# Patient Record
Sex: Male | Born: 1935 | Race: White | Hispanic: No | Marital: Married | Smoking: Former smoker
Health system: Southern US, Community
[De-identification: ages and names within clinical notes are randomized; demographics above are authoritative.]

## PROBLEM LIST (undated history)

## (undated) DIAGNOSIS — I10 Essential (primary) hypertension: Secondary | ICD-10-CM

## (undated) DIAGNOSIS — I2510 Atherosclerotic heart disease of native coronary artery without angina pectoris: Secondary | ICD-10-CM

## (undated) HISTORY — PX: ABDOMINAL AORTIC ANEURYSM REPAIR: SUR1152

## (undated) HISTORY — PX: COLECTOMY: SHX59

## (undated) HISTORY — PX: CORONARY ARTERY BYPASS GRAFT: SHX141

## (undated) HISTORY — PX: HERNIA REPAIR: SHX51

---

## 2020-01-10 ENCOUNTER — Inpatient Hospital Stay (HOSPITAL_BASED_OUTPATIENT_CLINIC_OR_DEPARTMENT_OTHER)
Admission: EM | Admit: 2020-01-10 | Discharge: 2020-01-16 | DRG: 481 | Disposition: A | Discharge status: Skilled Nursing Facility | Payer: Medicare Other | Attending: Internal Medicine | Admitting: Internal Medicine

## 2020-01-10 ENCOUNTER — Emergency Department (HOSPITAL_BASED_OUTPATIENT_CLINIC_OR_DEPARTMENT_OTHER): Payer: Medicare Other

## 2020-01-10 ENCOUNTER — Encounter (HOSPITAL_BASED_OUTPATIENT_CLINIC_OR_DEPARTMENT_OTHER): Payer: Self-pay | Admitting: *Deleted

## 2020-01-10 ENCOUNTER — Other Ambulatory Visit: Payer: Self-pay

## 2020-01-10 DIAGNOSIS — S72141A Displaced intertrochanteric fracture of right femur, initial encounter for closed fracture: Secondary | ICD-10-CM | POA: Diagnosis not present

## 2020-01-10 DIAGNOSIS — S72001A Fracture of unspecified part of neck of right femur, initial encounter for closed fracture: Secondary | ICD-10-CM

## 2020-01-10 DIAGNOSIS — D638 Anemia in other chronic diseases classified elsewhere: Secondary | ICD-10-CM | POA: Diagnosis present

## 2020-01-10 DIAGNOSIS — E785 Hyperlipidemia, unspecified: Secondary | ICD-10-CM | POA: Diagnosis present

## 2020-01-10 DIAGNOSIS — Z951 Presence of aortocoronary bypass graft: Secondary | ICD-10-CM

## 2020-01-10 DIAGNOSIS — Z419 Encounter for procedure for purposes other than remedying health state, unspecified: Secondary | ICD-10-CM

## 2020-01-10 DIAGNOSIS — D696 Thrombocytopenia, unspecified: Secondary | ICD-10-CM | POA: Diagnosis present

## 2020-01-10 DIAGNOSIS — D539 Nutritional anemia, unspecified: Secondary | ICD-10-CM | POA: Diagnosis present

## 2020-01-10 DIAGNOSIS — S72009A Fracture of unspecified part of neck of unspecified femur, initial encounter for closed fracture: Secondary | ICD-10-CM | POA: Diagnosis present

## 2020-01-10 DIAGNOSIS — W1830XA Fall on same level, unspecified, initial encounter: Secondary | ICD-10-CM | POA: Diagnosis present

## 2020-01-10 DIAGNOSIS — D62 Acute posthemorrhagic anemia: Secondary | ICD-10-CM | POA: Diagnosis not present

## 2020-01-10 DIAGNOSIS — H9190 Unspecified hearing loss, unspecified ear: Secondary | ICD-10-CM | POA: Diagnosis present

## 2020-01-10 DIAGNOSIS — E440 Moderate protein-calorie malnutrition: Secondary | ICD-10-CM | POA: Insufficient documentation

## 2020-01-10 DIAGNOSIS — Z87891 Personal history of nicotine dependence: Secondary | ICD-10-CM

## 2020-01-10 DIAGNOSIS — Z6823 Body mass index (BMI) 23.0-23.9, adult: Secondary | ICD-10-CM

## 2020-01-10 DIAGNOSIS — I1310 Hypertensive heart and chronic kidney disease without heart failure, with stage 1 through stage 4 chronic kidney disease, or unspecified chronic kidney disease: Secondary | ICD-10-CM | POA: Diagnosis present

## 2020-01-10 DIAGNOSIS — I739 Peripheral vascular disease, unspecified: Secondary | ICD-10-CM | POA: Diagnosis present

## 2020-01-10 DIAGNOSIS — W19XXXA Unspecified fall, initial encounter: Secondary | ICD-10-CM

## 2020-01-10 DIAGNOSIS — N1832 Chronic kidney disease, stage 3b: Secondary | ICD-10-CM | POA: Diagnosis present

## 2020-01-10 DIAGNOSIS — Z20822 Contact with and (suspected) exposure to covid-19: Secondary | ICD-10-CM | POA: Diagnosis present

## 2020-01-10 DIAGNOSIS — I2510 Atherosclerotic heart disease of native coronary artery without angina pectoris: Secondary | ICD-10-CM | POA: Diagnosis present

## 2020-01-10 HISTORY — DX: Essential (primary) hypertension: I10

## 2020-01-10 HISTORY — DX: Atherosclerotic heart disease of native coronary artery without angina pectoris: I25.10

## 2020-01-10 LAB — COMPREHENSIVE METABOLIC PANEL
ALT: 17 U/L (ref 0–44)
AST: 22 U/L (ref 15–41)
Albumin: 3.9 g/dL (ref 3.5–5.0)
Alkaline Phosphatase: 55 U/L (ref 38–126)
Anion gap: 11 (ref 5–15)
BUN: 42 mg/dL — ABNORMAL HIGH (ref 8–23)
CO2: 24 mmol/L (ref 22–32)
Calcium: 8.9 mg/dL (ref 8.9–10.3)
Chloride: 105 mmol/L (ref 98–111)
Creatinine, Ser: 1.82 mg/dL — ABNORMAL HIGH (ref 0.61–1.24)
GFR, Estimated: 34 mL/min — ABNORMAL LOW (ref >60)
Glucose, Bld: 118 mg/dL — ABNORMAL HIGH (ref 70–99)
Potassium: 5.1 mmol/L (ref 3.5–5.1)
Sodium: 140 mmol/L (ref 135–145)
Total Bilirubin: 0.3 mg/dL (ref 0.3–1.2)
Total Protein: 6.6 g/dL (ref 6.5–8.1)

## 2020-01-10 LAB — CBC WITH DIFFERENTIAL/PLATELET
Abs Immature Granulocytes: 0.04 10*3/uL (ref 0.00–0.07)
Basophils Absolute: 0 10*3/uL (ref 0.0–0.1)
Basophils Relative: 0 %
Eosinophils Absolute: 0.2 10*3/uL (ref 0.0–0.5)
Eosinophils Relative: 2 %
HCT: 38.3 % — ABNORMAL LOW (ref 39.0–52.0)
Hemoglobin: 12.8 g/dL — ABNORMAL LOW (ref 13.0–17.0)
Immature Granulocytes: 0 %
Lymphocytes Relative: 32 %
Lymphs Abs: 3.8 10*3/uL (ref 0.7–4.0)
MCH: 33.4 pg (ref 26.0–34.0)
MCHC: 33.4 g/dL (ref 30.0–36.0)
MCV: 100 fL (ref 80.0–100.0)
Monocytes Absolute: 0.8 10*3/uL (ref 0.1–1.0)
Monocytes Relative: 7 %
Neutro Abs: 6.9 10*3/uL (ref 1.7–7.7)
Neutrophils Relative %: 59 %
Platelets: 159 10*3/uL (ref 150–400)
RBC: 3.83 MIL/uL — ABNORMAL LOW (ref 4.22–5.81)
RDW: 12.7 % (ref 11.5–15.5)
WBC: 11.7 10*3/uL — ABNORMAL HIGH (ref 4.0–10.5)
nRBC: 0 % (ref 0.0–0.2)

## 2020-01-10 LAB — RESP PANEL BY RT-PCR (FLU A&B, COVID) ARPGX2
Influenza A by PCR: NEGATIVE
Influenza B by PCR: NEGATIVE
SARS Coronavirus 2 by RT PCR: NEGATIVE

## 2020-01-10 MED ORDER — SODIUM CHLORIDE 0.9 % IV SOLN: INTRAVENOUS | Status: DC

## 2020-01-10 NOTE — ED Triage Notes (Signed)
EDP in room upon pt arrival, C Collar removed by EDP

## 2020-01-10 NOTE — ED Notes (Signed)
HIGH FALL RISK BRACELET PLACED ON PT, PT INSTRUCTED NOT TO GET UP WITHOUT STAFF IN ROOM, BED IN LOWEST POSITION, SR X 2 UP, CALL BELL WITHIN REACH, WIFE AT SIDE, PLACED ON CONT POX MONITORING WITH INT NBP ASSESSMENTS

## 2020-01-10 NOTE — ED Notes (Signed)
Alert and oriented, wife at side to assist with obtaining hx. Pt points to rt lateral hip area where pain is the most. No discoloration or swelling currently noted at this time

## 2020-01-10 NOTE — ED Triage Notes (Signed)
Presents via [place], per EMS, pt fell, sliding out of his chair at home, pt c/o rt hip pain, per EMS and this assessment, no external rotation or shortening of RLE noted, EMS states pt denies his head hitting anything and currently not on any blood thinners. Arrived with C collar on by EMS

## 2020-01-10 NOTE — ED Provider Notes (Signed)
[facility] EMERGENCY DEPARTMENT Provider Note   CSN: [id number] Arrival date & time: 01/10/20  1919     History Chief Complaint  Patient presents with  . Fall    Jacob [name] is a 86 y.o. male.  Patient brought in by EMS from home.  Patient was pivoting to get in his chair when he fell.  Landing on his right hip.  Complaint of right hip pain.  Patient arrived by EMS with a c-collar.  But initially patient with good range of motion of the neck so was cleared.  But then he started to develop some neck discomfort.  Patient is not on any blood thinners.  Normally followed in the [place] system.  Has medical history significant for coronary disease and hypertension.  Cording patient's wife patient was fine earlier today and yesterday.  No signs of any illness        Past Medical History:  Diagnosis Date  . Coronary artery disease   . Hypertension     There are no problems to display for this patient.      No family history on file.  Social History   Tobacco Use  . Smoking status: Former Games developer  . Tobacco comment: quit 50years ago  Vaping Use  . Vaping Use: Never used  Substance Use Topics  . Alcohol use: Yes    Comment: socially  . Drug use: Never    Home Medications Prior to Admission medications   Not on File    Allergies    Patient has no known allergies.  Review of Systems   Review of Systems  Constitutional: Negative for chills and fever.  HENT: Negative for rhinorrhea and sore throat.   Eyes: Negative for visual disturbance.  Respiratory: Negative for cough and shortness of breath.   Cardiovascular: Negative for chest pain and leg swelling.  Gastrointestinal: Negative for abdominal pain, diarrhea, nausea and vomiting.  Genitourinary: Negative for dysuria.  Musculoskeletal: Positive for neck pain. Negative for back pain.  Skin: Negative for rash.  Neurological: Negative for dizziness, light-headedness and headaches.  Hematological:  Does not bruise/bleed easily.  Psychiatric/Behavioral: Negative for confusion.    Physical Exam Updated Vital Signs BP (!) 182/82 (BP Location: Right Arm)   Pulse 62   Temp 97.7 F (36.5 C) (Oral)   Resp 16   Ht 1.702 m (5\' 7" )   Wt 67.1 kg   SpO2 95%   BMI 23.18 kg/m   Physical Exam Vitals and nursing note reviewed.  Constitutional:      General: He is not in acute distress.    Appearance: Normal appearance. He is well-developed and well-nourished. He is not ill-appearing.  HENT:     Head: Normocephalic and atraumatic.  Eyes:     Extraocular Movements: Extraocular movements intact.     Conjunctiva/sclera: Conjunctivae normal.     Pupils: Pupils are equal, round, and reactive to light.  Cardiovascular:     Rate and Rhythm: Normal rate and regular rhythm.     Heart sounds: No murmur heard.   Pulmonary:     Effort: Pulmonary effort is normal. No respiratory distress.     Breath sounds: Normal breath sounds.  Abdominal:     Palpations: Abdomen is soft.     Tenderness: There is no abdominal tenderness.  Musculoskeletal:        General: Tenderness present. No swelling, deformity or edema. Normal range of motion.     Cervical back: Normal range of motion and neck supple.  No rigidity or tenderness.     Comments: Tenderness to palpation right hip area.  No leg shortening or rotation.  Can rotate leg back-and-forth without any pain.  Good cap refill distally.  Dorsalis pedis pulses 1+.  Skin:    General: Skin is warm and dry.     Capillary Refill: Capillary refill takes less than 2 seconds.  Neurological:     General: No focal deficit present.     Mental Status: He is alert and oriented to person, place, and time.     Cranial Nerves: No cranial nerve deficit.     Sensory: No sensory deficit.  Psychiatric:        Mood and Affect: Mood and affect normal.     ED Results / Procedures / Treatments   Labs (all labs ordered are listed, but only abnormal results are  displayed) Labs Reviewed  CBC WITH DIFFERENTIAL/PLATELET - Abnormal; Notable for the following components:      Result Value   WBC 11.7 (*)    RBC 3.83 (*)    Hemoglobin 12.8 (*)    HCT 38.3 (*)    All other components within normal limits  COMPREHENSIVE METABOLIC PANEL - Abnormal; Notable for the following components:   Glucose, Bld 118 (*)    BUN 42 (*)    Creatinine, Ser 1.82 (*)    GFR, Estimated 34 (*)    All other components within normal limits  RESP PANEL BY RT-PCR (FLU A&B, COVID) [id number]    EKG EKG Interpretation  Date/Time:  Saturday January 10 2020 21:12:06 EST Ventricular Rate:  61 PR Interval:    QRS Duration: 101 QT Interval:  470 QTC Calculation: 442 R Axis:   93 Text Interpretation: Atrial fibrillation Probable anterior infarct, old Nonspecific repol abnormality, lateral leads No previous ECGs available Confirmed by Vanetta Mulders [phone]) on 01/10/2020 9:27:07 PM   Radiology DG Chest 1 View  Result Date: 01/10/2020 CLINICAL DATA:  Right hip fracture EXAM: CHEST  1 VIEW COMPARISON:  None. FINDINGS: Cardiomegaly. Prior CABG. Lungs are clear. No effusions. No acute bony abnormality. IMPRESSION: Cardiomegaly.  No acute cardiopulmonary disease. Electronically Signed   By: Charlett Nose M.D.   On: 01/10/2020 21:47   CT Head Wo Contrast  Result Date: 01/10/2020 CLINICAL DATA:  Head trauma, fall sliding out of chair at home. EXAM: CT HEAD WITHOUT CONTRAST TECHNIQUE: Contiguous axial images were obtained from the base of the skull through the vertex without intravenous contrast. COMPARISON:  None. FINDINGS: Brain: Age related atrophy. Moderate periventricular and deep white matter hypodensity consistent with chronic small vessel ischemia. No intracranial hemorrhage, mass effect, or midline shift. No hydrocephalus. The basilar cisterns are patent. No evidence of territorial infarct or acute ischemia. No extra-axial or intracranial fluid collection. Vascular:  Atherosclerosis of skullbase vasculature without hyperdense vessel or abnormal calcification. Skull: No fracture or focal lesion. Sinuses/Orbits: No acute findings. Rounded soft tissue density in posterior right ethmoid air cells and left maxillary sinus may represent small polyps. Mastoid air cells are clear. Bilateral cataract resection. Other: None. IMPRESSION: 1. No acute intracranial abnormality. No skull fracture. 2. Age related atrophy and chronic small vessel ischemia. Electronically Signed   By: Narda Rutherford M.D.   On: 01/10/2020 21:41   CT Cervical Spine Wo Contrast  Result Date: 01/10/2020 CLINICAL DATA:  Cervical spine injury suspected, fall sliding out of chair at home. EXAM: CT CERVICAL SPINE WITHOUT CONTRAST TECHNIQUE: Multidetector CT imaging of the cervical spine was performed without intravenous  contrast. Multiplanar CT image reconstructions were also generated. COMPARISON:  None. FINDINGS: The anterior most aspect of C7 vertebral body is excluded from the field of view and sagittal and coronal reformats, no abnormalities seen on AP imaging. Alignment: Exaggerated cervical lordosis. No listhesis or traumatic subluxation. Skull base and vertebrae: No acute fracture. Vertebral body heights are maintained. The dens and skull base are intact. Degenerative pannus at C1-C2. Soft tissues and spinal canal: No prevertebral fluid or swelling. No visible canal hematoma. Disc levels: Mild disc space narrowing with endplate spurring at C5-C6. Additional endplate spurring with preservation of disc spaces. Multilevel facet hypertrophy. No bony canal stenosis. Upper chest: Negative. Other: Carotid calcifications. IMPRESSION: Mild degenerative change in the cervical spine without acute fracture or subluxation. Electronically Signed   By: Narda Rutherford M.D.   On: 01/10/2020 21:46   DG Hip Unilat W or Wo Pelvis 2-3 Views Right  Result Date: 01/10/2020 CLINICAL DATA:  Right hip pain.  Fall. EXAM: DG  HIP (WITH OR WITHOUT PELVIS) 2-3V RIGHT COMPARISON:  None. FINDINGS: There is a right femoral intertrochanteric fracture. No significant angulation. No subluxation or dislocation. Degenerative changes in the hips bilaterally. IMPRESSION: Right femoral intertrochanteric fracture. Electronically Signed   By: Charlett Nose M.D.   On: 01/10/2020 20:24    Procedures Procedures (including critical care time)  Medications Ordered in ED Medications  0.9 %  sodium chloride infusion ( Intravenous New Bag/Given 01/10/20 2228)    ED Course  I have reviewed the triage vital signs and the nursing notes.  Pertinent labs & imaging results that were available during my care of the patient were reviewed by me and considered in my medical decision making (see chart for details).    MDM Rules/Calculators/A&P                          X-ray of right hip and pelvis shows inner troches fracture of the right hip.  Proceeded with additional work-up since he would need surgery.  Chest x-ray without acute findings.  CT head neck without any acute findings.  CBC significant for white blood cell count of 11,000 hemoglobin 12.8.  And most importantly showing evidence of probably chronic renal failure with a BUN of 42 and a creatinine of 1.82.  EKG also suggestive of may be atrial fibrillation but is rate controlled.  Is not on any blood thinners.  With Dr. Devonne Doughty from [facility].  Who will accept the patient in consultation to fix his hip.  And also discussed with the hospitalist at [facility] or Dr. Devonne Doughty wanted patient admitted and they will admit the patient.  Covid testing was negative.     Final Clinical Impression(s) / ED Diagnoses Final diagnoses:  Fall, initial encounter  Closed fracture of right hip, initial encounter [facility])    Rx / DC Orders ED Discharge Orders    None       Vanetta Mulders, MD 01/10/20 2332

## 2020-01-10 NOTE — ED Notes (Signed)
Pt reports last intake at 1200 today.

## 2020-01-11 ENCOUNTER — Other Ambulatory Visit: Payer: Self-pay

## 2020-01-11 DIAGNOSIS — I10 Essential (primary) hypertension: Secondary | ICD-10-CM | POA: Diagnosis not present

## 2020-01-11 DIAGNOSIS — D62 Acute posthemorrhagic anemia: Secondary | ICD-10-CM | POA: Diagnosis not present

## 2020-01-11 DIAGNOSIS — S72001A Fracture of unspecified part of neck of right femur, initial encounter for closed fracture: Secondary | ICD-10-CM | POA: Diagnosis not present

## 2020-01-11 DIAGNOSIS — D539 Nutritional anemia, unspecified: Secondary | ICD-10-CM | POA: Diagnosis present

## 2020-01-11 DIAGNOSIS — N1832 Chronic kidney disease, stage 3b: Secondary | ICD-10-CM | POA: Diagnosis present

## 2020-01-11 DIAGNOSIS — S72141A Displaced intertrochanteric fracture of right femur, initial encounter for closed fracture: Secondary | ICD-10-CM | POA: Diagnosis present

## 2020-01-11 DIAGNOSIS — Z419 Encounter for procedure for purposes other than remedying health state, unspecified: Secondary | ICD-10-CM | POA: Diagnosis not present

## 2020-01-11 DIAGNOSIS — S72009A Fracture of unspecified part of neck of unspecified femur, initial encounter for closed fracture: Secondary | ICD-10-CM | POA: Diagnosis present

## 2020-01-11 DIAGNOSIS — Z20822 Contact with and (suspected) exposure to covid-19: Secondary | ICD-10-CM | POA: Diagnosis present

## 2020-01-11 DIAGNOSIS — E785 Hyperlipidemia, unspecified: Secondary | ICD-10-CM | POA: Diagnosis present

## 2020-01-11 DIAGNOSIS — I2510 Atherosclerotic heart disease of native coronary artery without angina pectoris: Secondary | ICD-10-CM | POA: Diagnosis present

## 2020-01-11 DIAGNOSIS — Z6823 Body mass index (BMI) 23.0-23.9, adult: Secondary | ICD-10-CM | POA: Diagnosis not present

## 2020-01-11 DIAGNOSIS — H9190 Unspecified hearing loss, unspecified ear: Secondary | ICD-10-CM | POA: Diagnosis present

## 2020-01-11 DIAGNOSIS — D696 Thrombocytopenia, unspecified: Secondary | ICD-10-CM | POA: Diagnosis present

## 2020-01-11 DIAGNOSIS — Z951 Presence of aortocoronary bypass graft: Secondary | ICD-10-CM | POA: Diagnosis not present

## 2020-01-11 DIAGNOSIS — I1310 Hypertensive heart and chronic kidney disease without heart failure, with stage 1 through stage 4 chronic kidney disease, or unspecified chronic kidney disease: Secondary | ICD-10-CM | POA: Diagnosis present

## 2020-01-11 DIAGNOSIS — D638 Anemia in other chronic diseases classified elsewhere: Secondary | ICD-10-CM | POA: Diagnosis present

## 2020-01-11 DIAGNOSIS — Z87891 Personal history of nicotine dependence: Secondary | ICD-10-CM | POA: Diagnosis not present

## 2020-01-11 DIAGNOSIS — E440 Moderate protein-calorie malnutrition: Secondary | ICD-10-CM | POA: Diagnosis present

## 2020-01-11 DIAGNOSIS — W1830XA Fall on same level, unspecified, initial encounter: Secondary | ICD-10-CM | POA: Diagnosis present

## 2020-01-11 DIAGNOSIS — W19XXXA Unspecified fall, initial encounter: Secondary | ICD-10-CM | POA: Diagnosis not present

## 2020-01-11 DIAGNOSIS — I739 Peripheral vascular disease, unspecified: Secondary | ICD-10-CM | POA: Diagnosis present

## 2020-01-11 LAB — CBC
HCT: 32.4 % — ABNORMAL LOW (ref 39.0–52.0)
Hemoglobin: 10.6 g/dL — ABNORMAL LOW (ref 13.0–17.0)
MCH: 33.2 pg (ref 26.0–34.0)
MCHC: 32.7 g/dL (ref 30.0–36.0)
MCV: 101.6 fL — ABNORMAL HIGH (ref 80.0–100.0)
Platelets: 132 10*3/uL — ABNORMAL LOW (ref 150–400)
RBC: 3.19 MIL/uL — ABNORMAL LOW (ref 4.22–5.81)
RDW: 12.9 % (ref 11.5–15.5)
WBC: 10.2 10*3/uL (ref 4.0–10.5)
nRBC: 0 % (ref 0.0–0.2)

## 2020-01-11 LAB — CREATININE, SERUM
Creatinine, Ser: 1.67 mg/dL — ABNORMAL HIGH (ref 0.61–1.24)
GFR, Estimated: 38 mL/min — ABNORMAL LOW (ref >60)

## 2020-01-11 MED ORDER — PRAVASTATIN SODIUM 40 MG PO TABS
80.0000 mg | ORAL_TABLET | Freq: Every day | ORAL | Status: DC
  Administered 2020-01-11 – 2020-01-15 (×4): 80 mg via ORAL
  Filled 2020-01-11 (×4): qty 2

## 2020-01-11 MED ORDER — ONDANSETRON 4 MG PO TBDP: 4.0000 mg | ORAL_TABLET | Freq: Three times a day (TID) | ORAL | Status: DC | PRN

## 2020-01-11 MED ORDER — HYDRALAZINE HCL 25 MG PO TABS
25.0000 mg | ORAL_TABLET | Freq: Three times a day (TID) | ORAL | Status: DC | PRN
  Administered 2020-01-12 – 2020-01-13 (×2): 25 mg via ORAL
  Filled 2020-01-11 (×2): qty 1

## 2020-01-11 MED ORDER — ENSURE PRE-SURGERY PO LIQD
296.0000 mL | Freq: Once | ORAL | Status: AC
  Administered 2020-01-12: 01:00:00 296 mL via ORAL
  Filled 2020-01-11: qty 296

## 2020-01-11 MED ORDER — MORPHINE SULFATE (PF) 2 MG/ML IV SOLN: 0.5000 mg | INTRAVENOUS | Status: DC | PRN

## 2020-01-11 MED ORDER — HEPARIN SODIUM (PORCINE) 5000 UNIT/ML IJ SOLN
5000.0000 [IU] | Freq: Three times a day (TID) | INTRAMUSCULAR | Status: DC
  Administered 2020-01-11 – 2020-01-16 (×14): 5000 [IU] via SUBCUTANEOUS
  Filled 2020-01-11 (×14): qty 1

## 2020-01-11 MED ORDER — MORPHINE SULFATE (PF) 4 MG/ML IV SOLN
4.0000 mg | Freq: Once | INTRAVENOUS | Status: AC
  Administered 2020-01-11: 4 mg via INTRAVENOUS
  Filled 2020-01-11: qty 1

## 2020-01-11 MED ORDER — MORPHINE SULFATE (PF) 4 MG/ML IV SOLN
4.0000 mg | Freq: Once | INTRAVENOUS | Status: AC
  Administered 2020-01-11: 08:00:00 4 mg via INTRAVENOUS
  Filled 2020-01-11: qty 1

## 2020-01-11 MED ORDER — MUPIROCIN 2 % EX OINT
1.0000 "application " | TOPICAL_OINTMENT | Freq: Two times a day (BID) | CUTANEOUS | Status: AC
  Administered 2020-01-12 – 2020-01-16 (×10): 1 via NASAL
  Filled 2020-01-11 (×3): qty 22

## 2020-01-11 MED ORDER — HYDROCODONE-ACETAMINOPHEN 5-325 MG PO TABS
1.0000 | ORAL_TABLET | Freq: Four times a day (QID) | ORAL | Status: DC | PRN
  Administered 2020-01-12: 1 via ORAL
  Filled 2020-01-11: qty 1

## 2020-01-11 MED ORDER — ASPIRIN EC 81 MG PO TBEC: 81.0000 mg | DELAYED_RELEASE_TABLET | Freq: Every day | ORAL | Status: DC

## 2020-01-11 NOTE — Plan of Care (Signed)
  Problem: Education: Goal: Knowledge of General Education information will improve Description: Including pain rating scale, medication(s)/side effects and non-pharmacologic comfort measures Outcome: Progressing   Problem: Clinical Measurements: Goal: Will remain free from infection Outcome: Progressing   Problem: Activity: Goal: Risk for activity intolerance will decrease Outcome: Progressing   Problem: Coping: Goal: Level of anxiety will decrease Outcome: Progressing   Problem: Safety: Goal: Ability to remain free from injury will improve Outcome: Progressing   Problem: Skin Integrity: Goal: Risk for impaired skin integrity will decrease Outcome: Progressing   Problem: Education: Goal: Verbalization of understanding the information provided (i.e., activity precautions, restrictions, etc) will improve Outcome: Progressing   Problem: Activity: Goal: Ability to ambulate and perform ADLs will improve Outcome: Progressing   Problem: Pain Management: Goal: Pain level will decrease Outcome: Progressing   Problem: Clinical Measurements: Goal: Respiratory complications will improve Outcome: Completed/Met

## 2020-01-11 NOTE — H&P (Signed)
History and Physical    Jacob [name] KZL:[id number] DOB: 1924/03/30 DOA: 01/10/2020  PCP: Malka So., MD  Patient coming from: [facility]  Chief Complaint: Hip pain after fall  HPI: Jacob [name] is a 86 y.o. male with medical history significant of HTN, CKD3b, PVD, CAD/CABG. Presenting after fall of chair yesterday. He reports that he was trying to sit in a chair yesterday when his cat jumped off of it and surprised him. He slipped to the floor and hurt his hip. No head injury, LOC. He remembers the entire fall and events after. Denies any CP, palpitations, dizziness prior to the fall. He was brought to the ED after.      ED Course: He was found to have a right hip fracture. Orthopedics was called (Dr. Ranell Patrick). He recommended transfer to [facility]. [facility] was called for admission.   Review of Systems:  Denies dyspnea, N, V, HA, syncopal episodes, seizure like activity. Review of systems is otherwise negative for all not mentioned in HPI.   PMHx Past Medical History:  Diagnosis Date  . Coronary artery disease   . Hypertension     PSHx AAA repair CABG  SocHx  reports that he has quit smoking. He does not have any smokeless tobacco history on file. He reports current alcohol use. He reports that he does not use drugs.  No Known Allergies  FamHx No family history on file.  Prior to Admission medications   Not on File    Physical Exam: Vitals:   01/11/20 0700 01/11/20 0715 01/11/20 0730 01/11/20 0900  BP: (!) 167/77  (!) 177/68 (!) 156/68  Pulse:    (!) 58  Resp: 14 14 14 20   Temp:   98.2 F (36.8 C) 97.7 F (36.5 C)  TempSrc:   Oral Oral  SpO2:   96% 94%  Weight:      Height:        General: 86 y.o. male resting in bed in NAD Eyes: PERRL, normal sclera ENMT: Nares patent w/o discharge, orophaynx clear, dentition normal, ears w/o discharge/lesions/ulcers Neck: Supple, trachea midline Cardiovascular: RRR, +S1, S2, no m/g/r, equal pulses throughout Respiratory:  CTABL, no w/r/r, normal WOB GI: BS+, NDNT, no masses noted, no organomegaly noted MSK: No e/c/c. Limited ROM in right hip d/t pain Skin: No rashes, bruises, ulcerations noted Neuro: A&O x 3, hard of hearing Psyc: Appropriate interaction and affect, calm/cooperative  Labs on Admission: I have personally reviewed following labs and imaging studies  CBC: Recent Labs  Lab 01/10/20 2114  WBC 11.7*  NEUTROABS 6.9  HGB 12.8*  HCT 38.3*  MCV 100.0  PLT 159   Basic Metabolic Panel: Recent Labs  Lab 01/10/20 2114  NA 140  K 5.1  CL 105  CO2 24  GLUCOSE 118*  BUN 42*  CREATININE 1.82*  CALCIUM 8.9   GFR: Estimated Creatinine Clearance: 23.7 mL/min (A) (by C-G formula based on SCr of 1.82 mg/dL (H)). Liver Function Tests: Recent Labs  Lab 01/10/20 2114  AST 22  ALT 17  ALKPHOS 55  BILITOT 0.3  PROT 6.6  ALBUMIN 3.9   No results for input(s): LIPASE, AMYLASE in the last 168 hours. No results for input(s): AMMONIA in the last 168 hours. Coagulation Profile: No results for input(s): INR, PROTIME in the last 168 hours. Cardiac Enzymes: No results for input(s): CKTOTAL, CKMB, CKMBINDEX, TROPONINI in the last 168 hours. BNP (last 3 results) No results for input(s): PROBNP in the last 8760 hours. HbA1C: No results  for input(s): HGBA1C in the last 72 hours. CBG: No results for input(s): GLUCAP in the last 168 hours. Lipid Profile: No results for input(s): CHOL, HDL, LDLCALC, TRIG, CHOLHDL, LDLDIRECT in the last 72 hours. Thyroid Function Tests: No results for input(s): TSH, T4TOTAL, FREET4, T3FREE, THYROIDAB in the last 72 hours. Anemia Panel: No results for input(s): VITAMINB12, FOLATE, FERRITIN, TIBC, IRON, RETICCTPCT in the last 72 hours. Urine analysis: No results found for: COLORURINE, APPEARANCEUR, LABSPEC, PHURINE, GLUCOSEU, HGBUR, BILIRUBINUR, KETONESUR, PROTEINUR, UROBILINOGEN, NITRITE, LEUKOCYTESUR  Radiological Exams on Admission: DG Chest 1 View  Result  Date: 01/10/2020 CLINICAL DATA:  Right hip fracture EXAM: CHEST  1 VIEW COMPARISON:  None. FINDINGS: Cardiomegaly. Prior CABG. Lungs are clear. No effusions. No acute bony abnormality. IMPRESSION: Cardiomegaly.  No acute cardiopulmonary disease. Electronically Signed   By: Charlett Nose M.D.   On: 01/10/2020 21:47   CT Head Wo Contrast  Result Date: 01/10/2020 CLINICAL DATA:  Head trauma, fall sliding out of chair at home. EXAM: CT HEAD WITHOUT CONTRAST TECHNIQUE: Contiguous axial images were obtained from the base of the skull through the vertex without intravenous contrast. COMPARISON:  None. FINDINGS: Brain: Age related atrophy. Moderate periventricular and deep white matter hypodensity consistent with chronic small vessel ischemia. No intracranial hemorrhage, mass effect, or midline shift. No hydrocephalus. The basilar cisterns are patent. No evidence of territorial infarct or acute ischemia. No extra-axial or intracranial fluid collection. Vascular: Atherosclerosis of skullbase vasculature without hyperdense vessel or abnormal calcification. Skull: No fracture or focal lesion. Sinuses/Orbits: No acute findings. Rounded soft tissue density in posterior right ethmoid air cells and left maxillary sinus may represent small polyps. Mastoid air cells are clear. Bilateral cataract resection. Other: None. IMPRESSION: 1. No acute intracranial abnormality. No skull fracture. 2. Age related atrophy and chronic small vessel ischemia. Electronically Signed   By: Narda Rutherford M.D.   On: 01/10/2020 21:41   CT Cervical Spine Wo Contrast  Result Date: 01/10/2020 CLINICAL DATA:  Cervical spine injury suspected, fall sliding out of chair at home. EXAM: CT CERVICAL SPINE WITHOUT CONTRAST TECHNIQUE: Multidetector CT imaging of the cervical spine was performed without intravenous contrast. Multiplanar CT image reconstructions were also generated. COMPARISON:  None. FINDINGS: The anterior most aspect of C7 vertebral  body is excluded from the field of view and sagittal and coronal reformats, no abnormalities seen on AP imaging. Alignment: Exaggerated cervical lordosis. No listhesis or traumatic subluxation. Skull base and vertebrae: No acute fracture. Vertebral body heights are maintained. The dens and skull base are intact. Degenerative pannus at C1-C2. Soft tissues and spinal canal: No prevertebral fluid or swelling. No visible canal hematoma. Disc levels: Mild disc space narrowing with endplate spurring at C5-C6. Additional endplate spurring with preservation of disc spaces. Multilevel facet hypertrophy. No bony canal stenosis. Upper chest: Negative. Other: Carotid calcifications. IMPRESSION: Mild degenerative change in the cervical spine without acute fracture or subluxation. Electronically Signed   By: Narda Rutherford M.D.   On: 01/10/2020 21:46   DG Hip Unilat W or Wo Pelvis 2-3 Views Right  Result Date: 01/10/2020 CLINICAL DATA:  Right hip pain.  Fall. EXAM: DG HIP (WITH OR WITHOUT PELVIS) 2-3V RIGHT COMPARISON:  None. FINDINGS: There is a right femoral intertrochanteric fracture. No significant angulation. No subluxation or dislocation. Degenerative changes in the hips bilaterally. IMPRESSION: Right femoral intertrochanteric fracture. Electronically Signed   By: Charlett Nose M.D.   On: 01/10/2020 20:24   Assessment/Plan Right hip fracture     -  admit to inpatient, telemetry     - orthopedics to eval him for possible surgery in the AM     - NPO after midnight  HTN     - awaiting med rec from pharm team     - reviewed last cards, IM notes; looks like he's on coreg, benicar, amlodipine  HLD     - pravastatin  CAD/CABG     - ASA, coreg, pravastatin  A fib?     - questionable a fib on EKG from yesterday. No history of a fib in chart. Rpt EKG  CKD3b     - follows w/ [facility] nephrology; baseline SCr looks to be around 1.6. Watch nephrotoxins    DVT prophylaxis: heparin  Code Status: FULL  Family  Communication: None at bedside.  Consults called: EDP spoke with ortho (Dr. Ranell Patrick)   Status is: Inpatient  Remains inpatient appropriate because:Unsafe d/c plan   Dispo: The patient is from: Home              Anticipated d/c is to: SNF              Anticipated d/c date is: 3 days              Patient currently is not medically stable to d/c.  Teddy Spike DO [facility]  If 7PM-7AM, please contact night-coverage [email or web address]  01/11/2020, 9:32 AM

## 2020-01-11 NOTE — Consult Note (Signed)
Reason for Consult:  Right femoral intertrochanteric fracture Referring Physician: Medicine  Jacob [name] is an 86 y.o. male.  HPI: Jacob [name] is a 86 y.o. male who presented to the ED after fall of chair on 01/10/2020. He reports that he was trying to sit in a chair yesterday when his cat jumped off of it and surprised him. He slipped to the floor and hurt his hip. No head injury, LOC. He remembers the entire fall and events after. Denies any CP, palpitations, dizziness prior to the fall. He was brought to the ED after.  Imaging of the hip while in the ER revealed a right hip fracture. Orthopedics was called and the patient was transfered to [facility]. [facility] was called for admission. Patient was seen and surgery discussed.  Risks, benefits and expectations were discussed with the patient.  Risks including but not limited to the risk of anesthesia, blood clots, nerve damage, blood vessel damage, failure of the prosthesis, infection and up to and including death.  Patient understand the risks, benefits and expectations and wishes to proceed with surgery.    Past Medical History:  Diagnosis Date  . Coronary artery disease   . Hypertension     Past Surgical History:  Procedure Laterality Date  . ABDOMINAL AORTIC ANEURYSM REPAIR    . COLECTOMY    . CORONARY ARTERY BYPASS GRAFT    . HERNIA REPAIR      History reviewed. No pertinent family history.  Social History:  reports that he has quit smoking. He has never used smokeless tobacco. He reports current alcohol use. He reports that he does not use drugs.  Allergies: No Known Allergies  Medications: I have reviewed the patient's current medications.  Results for orders placed or performed during the hospital encounter of 01/10/20 (from the past 48 hour(s))  CBC with Differential/Platelet     Status: Abnormal   Collection Time: 01/10/20  9:14 PM  Result Value Ref Range   WBC 11.7 (H) 4.0 - 10.5 K/uL   RBC 3.83 (L) 4.22 - 5.81 MIL/uL    Hemoglobin 12.8 (L) 13.0 - 17.0 g/dL   HCT 21.338.3 (L) 08.639.0 - 57.852.0 %   MCV 100.0 80.0 - 100.0 fL   MCH 33.4 26.0 - 34.0 pg   MCHC 33.4 30.0 - 36.0 g/dL   RDW 46.912.7 62.911.5 - 52.815.5 %   Platelets 159 150 - 400 K/uL   nRBC 0.0 0.0 - 0.2 %   Neutrophils Relative % 59 %   Neutro Abs 6.9 1.7 - 7.7 K/uL   Lymphocytes Relative 32 %   Lymphs Abs 3.8 0.7 - 4.0 K/uL   Monocytes Relative 7 %   Monocytes Absolute 0.8 0.1 - 1.0 K/uL   Eosinophils Relative 2 %   Eosinophils Absolute 0.2 0.0 - 0.5 K/uL   Basophils Relative 0 %   Basophils Absolute 0.0 0.0 - 0.1 K/uL   Immature Granulocytes 0 %   Abs Immature Granulocytes 0.04 0.00 - 0.07 K/uL    Comment: Performed at [facility], [address] [place], [place] [id number]  Comprehensive metabolic panel     Status: Abnormal   Collection Time: 01/10/20  9:14 PM  Result Value Ref Range   Sodium 140 135 - 145 mmol/L   Potassium 5.1 3.5 - 5.1 mmol/L   Chloride 105 98 - 111 mmol/L   CO2 24 22 - 32 mmol/L   Glucose, Bld 118 (H) 70 - 99 mg/dL    Comment: Glucose  reference range applies only to samples taken after fasting for at least 8 hours.   BUN 42 (H) 8 - 23 mg/dL   Creatinine, Ser 6.25 (H) 0.61 - 1.24 mg/dL   Calcium 8.9 8.9 - 63.8 mg/dL   Total Protein 6.6 6.5 - 8.1 g/dL   Albumin 3.9 3.5 - 5.0 g/dL   AST 22 15 - 41 U/L   ALT 17 0 - 44 U/L   Alkaline Phosphatase 55 38 - 126 U/L   Total Bilirubin 0.3 0.3 - 1.2 mg/dL   GFR, Estimated 34 (L) >60 mL/min    Comment: (NOTE) Calculated using the CKD-EPI Creatinine Equation (2021)    Anion gap 11 5 - 15    Comment: Performed at [facility], [address] [place], [place] [address]  Resp Panel by RT-PCR (Flu A&B, Covid) Nasopharyngeal Swab     Status: None   Collection Time: 01/10/20  9:20 PM   Specimen: Nasopharyngeal Swab; Nasopharyngeal(NP) swabs in vial transport medium  Result Value Ref Range   SARS Coronavirus 2 by RT PCR NEGATIVE NEGATIVE    Comment: (NOTE) SARS-CoV-2  target nucleic acids are NOT DETECTED.  The SARS-CoV-2 RNA is generally detectable in upper respiratory specimens during the acute phase of infection. The lowest concentration of SARS-CoV-2 viral copies this assay can detect is 138 copies/mL. A negative result does not preclude SARS-Cov-2 infection and should not be used as the sole basis for treatment or other patient management decisions. A negative result may occur with  improper specimen collection/handling, submission of specimen other than nasopharyngeal swab, presence of viral mutation(s) within the areas targeted by this assay, and inadequate number of viral copies(<138 copies/mL). A negative result must be combined with clinical observations, patient history, and epidemiological information. The expected result is Negative.  Fact Sheet for Patients:  BloggerCourse.com  Fact Sheet for Healthcare Providers:  SeriousBroker.it  This test is no t yet approved or cleared by the [place] FDA and  has been authorized for detection and/or diagnosis of SARS-CoV-2 by FDA under an Emergency Use Authorization (EUA). This EUA will remain  in effect (meaning this test can be used) for the duration of the COVID-19 declaration under Section 564(b)(1) of the Act, 21 U.S.C.section 360bbb-3(b)(1), unless the authorization is terminated  or revoked sooner.       Influenza A by PCR NEGATIVE NEGATIVE   Influenza B by PCR NEGATIVE NEGATIVE    Comment: (NOTE) The Xpert Xpress SARS-CoV-2/FLU/RSV plus assay is intended as an aid in the diagnosis of influenza from Nasopharyngeal swab specimens and should not be used as a sole basis for treatment. Nasal washings and aspirates are unacceptable for Xpert Xpress SARS-CoV-2/FLU/RSV testing.  Fact Sheet for Patients: BloggerCourse.com  Fact Sheet for Healthcare Providers: SeriousBroker.it  This  test is not yet approved or cleared by the [place] FDA and has been authorized for detection and/or diagnosis of SARS-CoV-2 by FDA under an Emergency Use Authorization (EUA). This EUA will remain in effect (meaning this test can be used) for the duration of the COVID-19 declaration under Section 564(b)(1) of the Act, 21 U.S.C. section 360bbb-3(b)(1), unless the authorization is terminated or revoked.  Performed at [facility], [address] [place], [place] [address]   CBC     Status: Abnormal   Collection Time: 01/11/20 11:28 AM  Result Value Ref Range   WBC 10.2 4.0 - 10.5 K/uL   RBC 3.19 (L) 4.22 - 5.81 MIL/uL  Hemoglobin 10.6 (L) 13.0 - 17.0 g/dL   HCT 54.6 (L) 27.0 - 35.0 %   MCV 101.6 (H) 80.0 - 100.0 fL   MCH 33.2 26.0 - 34.0 pg   MCHC 32.7 30.0 - 36.0 g/dL   RDW 09.3 81.8 - 29.9 %   Platelets 132 (L) 150 - 400 K/uL   nRBC 0.0 0.0 - 0.2 %    Comment: Performed at [facility], [address] [place], [place] [address]  Creatinine, serum     Status: Abnormal   Collection Time: 01/11/20 11:28 AM  Result Value Ref Range   Creatinine, Ser 1.67 (H) 0.61 - 1.24 mg/dL   GFR, Estimated 38 (L) >60 mL/min    Comment: (NOTE) Calculated using the CKD-EPI Creatinine Equation (2021) Performed at [facility], [address] [place], [place] [address]     DG Chest 1 View  Result Date: 01/10/2020 CLINICAL DATA:  Right hip fracture EXAM: CHEST  1 VIEW COMPARISON:  None. FINDINGS: Cardiomegaly. Prior CABG. Lungs are clear. No effusions. No acute bony abnormality. IMPRESSION: Cardiomegaly.  No acute cardiopulmonary disease. Electronically Signed   By: Charlett Nose M.D.   On: 01/10/2020 21:47   CT Head Wo Contrast  Result Date: 01/10/2020 CLINICAL DATA:  Head trauma, fall sliding out of chair at home. EXAM: CT HEAD WITHOUT CONTRAST TECHNIQUE: Contiguous axial images were obtained from the base of the skull through the vertex  without intravenous contrast. COMPARISON:  None. FINDINGS: Brain: Age related atrophy. Moderate periventricular and deep white matter hypodensity consistent with chronic small vessel ischemia. No intracranial hemorrhage, mass effect, or midline shift. No hydrocephalus. The basilar cisterns are patent. No evidence of territorial infarct or acute ischemia. No extra-axial or intracranial fluid collection. Vascular: Atherosclerosis of skullbase vasculature without hyperdense vessel or abnormal calcification. Skull: No fracture or focal lesion. Sinuses/Orbits: No acute findings. Rounded soft tissue density in posterior right ethmoid air cells and left maxillary sinus may represent small polyps. Mastoid air cells are clear. Bilateral cataract resection. Other: None. IMPRESSION: 1. No acute intracranial abnormality. No skull fracture. 2. Age related atrophy and chronic small vessel ischemia. Electronically Signed   By: Narda Rutherford M.D.   On: 01/10/2020 21:41   CT Cervical Spine Wo Contrast  Result Date: 01/10/2020 CLINICAL DATA:  Cervical spine injury suspected, fall sliding out of chair at home. EXAM: CT CERVICAL SPINE WITHOUT CONTRAST TECHNIQUE: Multidetector CT imaging of the cervical spine was performed without intravenous contrast. Multiplanar CT image reconstructions were also generated. COMPARISON:  None. FINDINGS: The anterior most aspect of C7 vertebral body is excluded from the field of view and sagittal and coronal reformats, no abnormalities seen on AP imaging. Alignment: Exaggerated cervical lordosis. No listhesis or traumatic subluxation. Skull base and vertebrae: No acute fracture. Vertebral body heights are maintained. The dens and skull base are intact. Degenerative pannus at C1-C2. Soft tissues and spinal canal: No prevertebral fluid or swelling. No visible canal hematoma. Disc levels: Mild disc space narrowing with endplate spurring at C5-C6. Additional endplate spurring with preservation of  disc spaces. Multilevel facet hypertrophy. No bony canal stenosis. Upper chest: Negative. Other: Carotid calcifications. IMPRESSION: Mild degenerative change in the cervical spine without acute fracture or subluxation. Electronically Signed   By: Narda Rutherford M.D.   On: 01/10/2020 21:46   DG Hip Unilat W or Wo Pelvis 2-3 Views Right  Result Date: 01/10/2020 CLINICAL DATA:  Right hip pain.  Fall. EXAM: DG HIP (WITH OR  WITHOUT PELVIS) 2-3V RIGHT COMPARISON:  None. FINDINGS: There is a right femoral intertrochanteric fracture. No significant angulation. No subluxation or dislocation. Degenerative changes in the hips bilaterally. IMPRESSION: Right femoral intertrochanteric fracture. Electronically Signed   By: Charlett Nose M.D.   On: 01/10/2020 20:24    Review of Systems  Constitutional: Negative.   HENT: Negative.   Eyes: Negative.   Respiratory: Negative.   Cardiovascular: Negative.   Gastrointestinal: Negative.   Genitourinary: Negative.   Musculoskeletal: Positive for joint pain.  Skin: Negative.   Neurological: Negative.   Endo/Heme/Allergies: Negative.   Psychiatric/Behavioral: Negative.       Blood pressure (!) 156/68, pulse (!) 58, temperature 97.7 F (36.5 C), temperature source Oral, resp. rate 20, height 5\' 7"  (1.702 m), weight 67.1 kg, SpO2 94 %. Physical Exam Constitutional:      Appearance: He is well-developed.  HENT:     Head: Normocephalic.  Eyes:     Pupils: Pupils are equal, round, and reactive to light.  Neck:     Thyroid: No thyromegaly.     Vascular: No JVD.     Trachea: No tracheal deviation.  Cardiovascular:     Rate and Rhythm: Normal rate and regular rhythm.     Pulses: Intact distal pulses.  Pulmonary:     Effort: Pulmonary effort is normal. No respiratory distress.     Breath sounds: Normal breath sounds. No wheezing.  Abdominal:     Palpations: Abdomen is soft.     Tenderness: There is no abdominal tenderness. There is no guarding.   Musculoskeletal:     Cervical back: Neck supple.     Right hip: Tenderness and bony tenderness present. Decreased range of motion. Decreased strength.  Lymphadenopathy:     Cervical: No cervical adenopathy.  Skin:    General: Skin is warm and dry.  Neurological:     Mental Status: He is alert and oriented to person, place, and time.  Psychiatric:        Mood and Affect: Mood and affect normal.     Assessment/Plan: Right femoral intertrochanteric fracture  - I have discussed with the patient regarding the fracture in the right hip - We have discussed surgical repair of the fracture to allow him to try to get back to his normal activities - Risks, benefits and expectations were discussed with the patient.  Risks including but not limited to the risk of anesthesia, blood clots, nerve damage, blood vessel damage, failure of the prosthesis, infection and up to and including death.  Patient understand the risks, benefits and expectations and wishes to proceed with surgery.  - Ortho Urgent Order set placed - Plan for a right IM nail tomorrow afternoon per Dr. - NPO per orders placed   Charlann Boxer [facility] 01/11/2020, 1:07 PM

## 2020-01-11 NOTE — Progress Notes (Signed)
Pt's wife took hearing aids will charge and return tomorrow. Pt's wife is asking to delay signing of the consent form until she speaks with the surgeon (Dr. Charlann Boxer). I spoke with Lurena Joiner via the Orthopedic answering service she will send message to Dr. Charlann Boxer to contact wife as soon as possible.

## 2020-01-11 NOTE — ED Notes (Signed)
He changed his mind and has elected to take IV pain medicine, ordered by Dr. Charm Barges. [facility] loads him onto their stretcher at this time.

## 2020-01-12 ENCOUNTER — Encounter (HOSPITAL_COMMUNITY)
Admission: EM | Disposition: A | Discharge status: Skilled Nursing Facility | Payer: Self-pay | Source: Home / Self Care | Attending: Internal Medicine

## 2020-01-12 ENCOUNTER — Inpatient Hospital Stay (HOSPITAL_COMMUNITY): Payer: Medicare Other | Admitting: Anesthesiology

## 2020-01-12 ENCOUNTER — Inpatient Hospital Stay (HOSPITAL_COMMUNITY): Payer: Medicare Other

## 2020-01-12 ENCOUNTER — Encounter (HOSPITAL_COMMUNITY): Payer: Self-pay | Admitting: Internal Medicine

## 2020-01-12 DIAGNOSIS — I2583 Coronary atherosclerosis due to lipid rich plaque: Secondary | ICD-10-CM

## 2020-01-12 DIAGNOSIS — I10 Essential (primary) hypertension: Secondary | ICD-10-CM

## 2020-01-12 DIAGNOSIS — D539 Nutritional anemia, unspecified: Secondary | ICD-10-CM

## 2020-01-12 DIAGNOSIS — I2510 Atherosclerotic heart disease of native coronary artery without angina pectoris: Secondary | ICD-10-CM

## 2020-01-12 HISTORY — PX: FEMUR IM NAIL: SHX1597

## 2020-01-12 LAB — BASIC METABOLIC PANEL
Anion gap: 12 (ref 5–15)
BUN: 33 mg/dL — ABNORMAL HIGH (ref 8–23)
CO2: 20 mmol/L — ABNORMAL LOW (ref 22–32)
Calcium: 7.9 mg/dL — ABNORMAL LOW (ref 8.9–10.3)
Chloride: 108 mmol/L (ref 98–111)
Creatinine, Ser: 1.63 mg/dL — ABNORMAL HIGH (ref 0.61–1.24)
GFR, Estimated: 39 mL/min — ABNORMAL LOW (ref >60)
Glucose, Bld: 152 mg/dL — ABNORMAL HIGH (ref 70–99)
Potassium: 4.3 mmol/L (ref 3.5–5.1)
Sodium: 140 mmol/L (ref 135–145)

## 2020-01-12 LAB — CBC
HCT: 30 % — ABNORMAL LOW (ref 39.0–52.0)
Hemoglobin: 9.6 g/dL — ABNORMAL LOW (ref 13.0–17.0)
MCH: 33.1 pg (ref 26.0–34.0)
MCHC: 32 g/dL (ref 30.0–36.0)
MCV: 103.4 fL — ABNORMAL HIGH (ref 80.0–100.0)
Platelets: 120 10*3/uL — ABNORMAL LOW (ref 150–400)
RBC: 2.9 MIL/uL — ABNORMAL LOW (ref 4.22–5.81)
RDW: 12.9 % (ref 11.5–15.5)
WBC: 10.1 10*3/uL (ref 4.0–10.5)
nRBC: 0 % (ref 0.0–0.2)

## 2020-01-12 LAB — ABO/RH: ABO/RH(D): A POS

## 2020-01-12 LAB — SURGICAL PCR SCREEN
MRSA, PCR: NEGATIVE
Staphylococcus aureus: NEGATIVE

## 2020-01-12 LAB — TYPE AND SCREEN
ABO/RH(D): A POS
Antibody Screen: NEGATIVE

## 2020-01-12 SURGERY — INSERTION, INTRAMEDULLARY ROD, FEMUR
Anesthesia: General | Laterality: Right

## 2020-01-12 MED ORDER — SUGAMMADEX SODIUM 200 MG/2ML IV SOLN
INTRAVENOUS | Status: DC | PRN
  Administered 2020-01-12: 150 mg via INTRAVENOUS

## 2020-01-12 MED ORDER — FENTANYL CITRATE (PF) 250 MCG/5ML IJ SOLN
INTRAMUSCULAR | Status: AC
  Filled 2020-01-12: qty 5

## 2020-01-12 MED ORDER — METOCLOPRAMIDE HCL 5 MG PO TABS: 5.0000 mg | ORAL_TABLET | Freq: Three times a day (TID) | ORAL | Status: DC | PRN

## 2020-01-12 MED ORDER — PROPOFOL 10 MG/ML IV BOLUS
INTRAVENOUS | Status: DC | PRN
  Administered 2020-01-12: 60 mg via INTRAVENOUS

## 2020-01-12 MED ORDER — ONDANSETRON HCL 4 MG/2ML IJ SOLN: 4.0000 mg | Freq: Four times a day (QID) | INTRAMUSCULAR | Status: DC | PRN

## 2020-01-12 MED ORDER — POVIDONE-IODINE 10 % EX SWAB
2.0000 "application " | Freq: Once | CUTANEOUS | Status: AC
  Administered 2020-01-12: 2 via TOPICAL

## 2020-01-12 MED ORDER — CEFAZOLIN SODIUM-DEXTROSE 2-4 GM/100ML-% IV SOLN: 2.0000 g | Freq: Four times a day (QID) | INTRAVENOUS | Status: DC

## 2020-01-12 MED ORDER — ONDANSETRON HCL 4 MG PO TABS: 4.0000 mg | ORAL_TABLET | Freq: Four times a day (QID) | ORAL | Status: DC | PRN

## 2020-01-12 MED ORDER — MENTHOL 3 MG MT LOZG: 1.0000 | LOZENGE | OROMUCOSAL | Status: DC | PRN

## 2020-01-12 MED ORDER — ADULT MULTIVITAMIN W/MINERALS CH
1.0000 | ORAL_TABLET | Freq: Every day | ORAL | Status: DC
  Administered 2020-01-13 – 2020-01-16 (×4): 1 via ORAL
  Filled 2020-01-12 (×4): qty 1

## 2020-01-12 MED ORDER — ACETAMINOPHEN 325 MG PO TABS
325.0000 mg | ORAL_TABLET | Freq: Four times a day (QID) | ORAL | Status: DC | PRN
  Administered 2020-01-14 – 2020-01-15 (×3): 650 mg via ORAL
  Filled 2020-01-12 (×3): qty 2

## 2020-01-12 MED ORDER — CARVEDILOL 6.25 MG PO TABS
6.2500 mg | ORAL_TABLET | Freq: Two times a day (BID) | ORAL | Status: DC
  Administered 2020-01-13 – 2020-01-16 (×7): 6.25 mg via ORAL
  Filled 2020-01-12 (×8): qty 1

## 2020-01-12 MED ORDER — CHLORHEXIDINE GLUCONATE 4 % EX LIQD
60.0000 mL | Freq: Once | CUTANEOUS | Status: AC
  Administered 2020-01-12: 4 via TOPICAL
  Filled 2020-01-12: qty 60

## 2020-01-12 MED ORDER — PHENYLEPHRINE 40 MCG/ML (10ML) SYRINGE FOR IV PUSH (FOR BLOOD PRESSURE SUPPORT)
PREFILLED_SYRINGE | INTRAVENOUS | Status: DC | PRN
  Administered 2020-01-12 (×2): 80 ug via INTRAVENOUS

## 2020-01-12 MED ORDER — FENTANYL CITRATE (PF) 100 MCG/2ML IJ SOLN: 25.0000 ug | INTRAMUSCULAR | Status: DC | PRN

## 2020-01-12 MED ORDER — 0.9 % SODIUM CHLORIDE (POUR BTL) OPTIME
TOPICAL | Status: DC | PRN
  Administered 2020-01-12: 19:00:00 1000 mL

## 2020-01-12 MED ORDER — LACTATED RINGERS IV SOLN: INTRAVENOUS | Status: DC | PRN

## 2020-01-12 MED ORDER — ALUM & MAG HYDROXIDE-SIMETH 200-200-20 MG/5ML PO SUSP
30.0000 mL | ORAL | Status: DC | PRN
  Administered 2020-01-15 – 2020-01-16 (×4): 30 mL via ORAL
  Filled 2020-01-12 (×4): qty 30

## 2020-01-12 MED ORDER — METHOCARBAMOL 1000 MG/10ML IJ SOLN: 500.0000 mg | Freq: Four times a day (QID) | INTRAVENOUS | Status: DC | PRN

## 2020-01-12 MED ORDER — DOCUSATE SODIUM 100 MG PO CAPS
100.0000 mg | ORAL_CAPSULE | Freq: Two times a day (BID) | ORAL | Status: DC
  Administered 2020-01-13 – 2020-01-15 (×6): 100 mg via ORAL
  Filled 2020-01-12 (×6): qty 1

## 2020-01-12 MED ORDER — METHOCARBAMOL 500 MG PO TABS: 500.0000 mg | ORAL_TABLET | Freq: Four times a day (QID) | ORAL | Status: DC | PRN

## 2020-01-12 MED ORDER — PHENOL 1.4 % MT LIQD: 1.0000 | OROMUCOSAL | Status: DC | PRN

## 2020-01-12 MED ORDER — PRAVASTATIN SODIUM 40 MG PO TABS: 80.0000 mg | ORAL_TABLET | Freq: Every evening | ORAL | Status: DC

## 2020-01-12 MED ORDER — FERROUS SULFATE 325 (65 FE) MG PO TABS
325.0000 mg | ORAL_TABLET | Freq: Three times a day (TID) | ORAL | Status: DC
Start: 2020-01-13 — End: 2020-01-16
  Administered 2020-01-13 – 2020-01-16 (×10): 325 mg via ORAL
  Filled 2020-01-12 (×11): qty 1

## 2020-01-12 MED ORDER — ONDANSETRON HCL 4 MG/2ML IJ SOLN
4.0000 mg | Freq: Four times a day (QID) | INTRAMUSCULAR | Status: DC | PRN
  Administered 2020-01-12: 09:00:00 4 mg via INTRAVENOUS

## 2020-01-12 MED ORDER — HYDROCODONE-ACETAMINOPHEN 5-325 MG PO TABS: 1.0000 | ORAL_TABLET | ORAL | Status: DC | PRN

## 2020-01-12 MED ORDER — ONDANSETRON HCL 4 MG/2ML IJ SOLN
INTRAMUSCULAR | Status: AC
  Filled 2020-01-12: qty 2

## 2020-01-12 MED ORDER — ACETAMINOPHEN 10 MG/ML IV SOLN: 1000.0000 mg | Freq: Once | INTRAVENOUS | Status: DC | PRN

## 2020-01-12 MED ORDER — PROSOURCE PLUS PO LIQD
30.0000 mL | Freq: Two times a day (BID) | ORAL | Status: DC
  Administered 2020-01-13 – 2020-01-15 (×6): 30 mL via ORAL
  Filled 2020-01-12 (×6): qty 30

## 2020-01-12 MED ORDER — ROCURONIUM BROMIDE 10 MG/ML (PF) SYRINGE
PREFILLED_SYRINGE | INTRAVENOUS | Status: DC | PRN
  Administered 2020-01-12: 40 mg via INTRAVENOUS

## 2020-01-12 MED ORDER — ASPIRIN EC 325 MG PO TBEC
325.0000 mg | DELAYED_RELEASE_TABLET | Freq: Two times a day (BID) | ORAL | Status: DC
  Administered 2020-01-13 – 2020-01-16 (×7): 325 mg via ORAL
  Filled 2020-01-12 (×7): qty 1

## 2020-01-12 MED ORDER — DEXAMETHASONE SODIUM PHOSPHATE 10 MG/ML IJ SOLN
INTRAMUSCULAR | Status: DC | PRN
  Administered 2020-01-12: 5 mg via INTRAVENOUS

## 2020-01-12 MED ORDER — ENSURE ENLIVE PO LIQD
237.0000 mL | Freq: Two times a day (BID) | ORAL | Status: DC
  Administered 2020-01-13 – 2020-01-16 (×7): 237 mL via ORAL

## 2020-01-12 MED ORDER — ONDANSETRON HCL 4 MG/2ML IJ SOLN
INTRAMUSCULAR | Status: DC | PRN
  Administered 2020-01-12: 4 mg via INTRAVENOUS

## 2020-01-12 MED ORDER — FENTANYL CITRATE (PF) 100 MCG/2ML IJ SOLN
INTRAMUSCULAR | Status: DC | PRN
  Administered 2020-01-12 (×2): 50 ug via INTRAVENOUS

## 2020-01-12 MED ORDER — CHLORHEXIDINE GLUCONATE CLOTH 2 % EX PADS
6.0000 | MEDICATED_PAD | Freq: Every day | CUTANEOUS | Status: DC
  Administered 2020-01-13 – 2020-01-16 (×4): 6 via TOPICAL

## 2020-01-12 MED ORDER — METOCLOPRAMIDE HCL 5 MG/ML IJ SOLN: 5.0000 mg | Freq: Three times a day (TID) | INTRAMUSCULAR | Status: DC | PRN

## 2020-01-12 MED ORDER — STERILE WATER FOR IRRIGATION IR SOLN
Status: DC | PRN
  Administered 2020-01-12: 2000 mL

## 2020-01-12 MED ORDER — LIDOCAINE 2% (20 MG/ML) 5 ML SYRINGE
INTRAMUSCULAR | Status: DC | PRN
  Administered 2020-01-12: 60 mg via INTRAVENOUS

## 2020-01-12 MED ORDER — MORPHINE SULFATE (PF) 2 MG/ML IV SOLN: 0.5000 mg | INTRAVENOUS | Status: DC | PRN

## 2020-01-12 MED ORDER — CHLORHEXIDINE GLUCONATE 0.12 % MT SOLN
15.0000 mL | OROMUCOSAL | Status: AC
  Administered 2020-01-12: 17:00:00 15 mL via OROMUCOSAL

## 2020-01-12 MED ORDER — AMLODIPINE BESYLATE 5 MG PO TABS
5.0000 mg | ORAL_TABLET | Freq: Every day | ORAL | Status: DC
  Administered 2020-01-13 – 2020-01-16 (×4): 5 mg via ORAL
  Filled 2020-01-12 (×4): qty 1

## 2020-01-12 MED ORDER — LACTATED RINGERS IV SOLN: Freq: Once | INTRAVENOUS | Status: AC

## 2020-01-12 MED ORDER — CEFAZOLIN SODIUM-DEXTROSE 1-4 GM/50ML-% IV SOLN
1.0000 g | Freq: Two times a day (BID) | INTRAVENOUS | Status: AC
  Administered 2020-01-13 (×2): 1 g via INTRAVENOUS
  Filled 2020-01-12 (×2): qty 50

## 2020-01-12 MED ORDER — TRANEXAMIC ACID-NACL 1000-0.7 MG/100ML-% IV SOLN
1000.0000 mg | INTRAVENOUS | Status: AC
  Administered 2020-01-12: 18:00:00 1000 mg via INTRAVENOUS
  Filled 2020-01-12: qty 100

## 2020-01-12 MED ORDER — CEFAZOLIN SODIUM-DEXTROSE 2-4 GM/100ML-% IV SOLN
2.0000 g | INTRAVENOUS | Status: AC
Start: 2020-01-12 — End: 2020-01-12
  Administered 2020-01-12: 18:00:00 2 g via INTRAVENOUS
  Filled 2020-01-12 (×2): qty 100

## 2020-01-12 SURGICAL SUPPLY — 39 items
ADH SKN CLS APL DERMABOND .7 (GAUZE/BANDAGES/DRESSINGS) ×1
BAG SPEC THK2 15X12 ZIP CLS (MISCELLANEOUS)
BAG ZIPLOCK 12X15 (MISCELLANEOUS) IMPLANT
BIT DRILL CANN LG 4.3MM (BIT) ×1 IMPLANT
BNDG GAUZE ELAST 4 BULKY (GAUZE/BANDAGES/DRESSINGS) ×3 IMPLANT
COVER PERINEAL POST (MISCELLANEOUS) ×3 IMPLANT
COVER SURGICAL LIGHT HANDLE (MISCELLANEOUS) ×3 IMPLANT
COVER WAND RF STERILE (DRAPES) IMPLANT
DERMABOND ADVANCED (GAUZE/BANDAGES/DRESSINGS) ×2
DERMABOND ADVANCED .7 DNX12 (GAUZE/BANDAGES/DRESSINGS) ×1 IMPLANT
DRAPE STERI IOBAN 125X83 (DRAPES) ×3 IMPLANT
DRILL BIT CANN LG 4.3MM (BIT) ×3
DRSG AQUACEL AG ADV 3.5X 4 (GAUZE/BANDAGES/DRESSINGS) IMPLANT
DRSG AQUACEL AG ADV 3.5X 6 (GAUZE/BANDAGES/DRESSINGS) ×6 IMPLANT
DURAPREP 26ML APPLICATOR (WOUND CARE) ×3 IMPLANT
ELECT REM PT RETURN 15FT ADLT (MISCELLANEOUS) ×3 IMPLANT
GLOVE BIOGEL PI IND STRL 7.5 (GLOVE) ×1 IMPLANT
GLOVE BIOGEL PI IND STRL 8.5 (GLOVE) ×1 IMPLANT
GLOVE BIOGEL PI INDICATOR 7.5 (GLOVE) ×2
GLOVE BIOGEL PI INDICATOR 8.5 (GLOVE) ×2
GLOVE ECLIPSE 8.0 STRL XLNG CF (GLOVE) ×3 IMPLANT
GLOVE ORTHO TXT STRL SZ7.5 (GLOVE) ×3 IMPLANT
GOWN STRL REUS W/TWL LRG LVL3 (GOWN DISPOSABLE) ×3 IMPLANT
GOWN STRL REUS W/TWL XL LVL3 (GOWN DISPOSABLE) ×3 IMPLANT
GUIDEPIN 3.2X17.5 THRD DISP (PIN) ×3 IMPLANT
KIT BASIN OR (CUSTOM PROCEDURE TRAY) ×3 IMPLANT
KIT TURNOVER KIT A (KITS) IMPLANT
MANIFOLD NEPTUNE II (INSTRUMENTS) ×3 IMPLANT
NAIL HIP FRACT 130D 11X180 (Screw) ×3 IMPLANT
PACK GENERAL/GYN (CUSTOM PROCEDURE TRAY) ×3 IMPLANT
PENCIL SMOKE EVACUATOR (MISCELLANEOUS) IMPLANT
PROTECTOR NERVE ULNAR (MISCELLANEOUS) ×3 IMPLANT
SCREW BONE CORTICAL 5.0X38 (Screw) ×3 IMPLANT
SCREW LAG 10.5MMX105MM HFN (Screw) ×3 IMPLANT
SUT VIC AB 1 CT1 27 (SUTURE) ×3
SUT VIC AB 1 CT1 27XBRD ANTBC (SUTURE) ×1 IMPLANT
SUT VIC AB 2-0 CT1 27 (SUTURE) ×3
SUT VIC AB 2-0 CT1 TAPERPNT 27 (SUTURE) ×1 IMPLANT
TOWEL OR 17X26 10 PK STRL BLUE (TOWEL DISPOSABLE) ×3 IMPLANT

## 2020-01-12 NOTE — Op Note (Signed)
Jacob [name], Jacob [name] MEDICAL RECORD [id number] ACCOUNT [id number] DATE OF BIRTH:1924/03/08 FACILITY: [facility] LOCATION: [facility] PHYSICIAN:Tilia Faso DCharlann Boxer, MD  OPERATIVE REPORT  DATE OF PROCEDURE:  01/12/2020  PREOPERATIVE DIAGNOSIS:  Right intertrochanteric femur fracture/basicervical.  POSTOPERATIVE DIAGNOSIS:  Right intertrochanteric femur fracture/basicervical.  PROCEDURE:  Open reduction internal fixation of right intertrochanteric femur fracture utilizing a Biomet/Zimmer Affixus nail 11 x 180 mm with a 130 mm lag screw and a distal interlock.  SURGEON:  Durene Romans, MD  ASSISTANT:  Lanney Gins, PA-C.  Note that Mr. Jacob [name] was present for the entirety of the case from preoperative positioning, perioperative management of the operative extremity, general facilitation of the case and primary wound closure.  ANESTHESIA:  General.  ESTIMATED BLOOD LOSS:  Less than 150 mL.  DRAINS:  None.  COMPLICATIONS:  None.  INDICATIONS:  The patient is a pleasant 86 year old male who lives with his wife independently.  He unfortunately had a ground level fall.  He had immediate onset of pain and was brought first initially to [facility] where radiographic workup  revealed a right intertrochanteric fracture.  He was subsequently admitted to the [facility] and admitted to the hospitalist service.  He was seen and evaluated.  Fracture was identified as an intertrochanteric fracture that required fixation  based on the displacement.  We reviewed the risks of malunion, nonunion, infection, DVT, and need for future surgeries.  Consent was obtained for the benefit of pain relief.  PROCEDURE IN DETAIL:  The patient was brought to the operative theater.  Once adequate anesthesia, preoperative antibiotics, Ancef administered as well as tranexamic acid, he was positioned supine on the fracture table.  The unaffected left extremity was  flexed and abducted out of the way with  bony prominences padded.  The right foot was placed in a traction boot.  Under fluoroscopic imaging applied traction and internal rotation, reduced the fracture to a near anatomic position.  The right hip was then  prepped and draped in sterile fashion.  A timeout was performed identifying the patient, the planned procedure and extremity.  Fluoroscopy was then brought back to the field.  The tip of the trochanter was identified.  An incision was made proximal and  lateral to this.  The soft tissue dissection was carried through the gluteal fascia.  The guidewire was then inserted into the tip of the trochanter and then passed into the femur across the fracture site, confirmed radiographically.  I then opened up  the proximal femur using a starting drill.  We then passed the nail by hand to its appropriate depth.  Then, using the insertion guide handle, we used the guidewire guide to place a guidewire into the center of the head in AP and lateral planes confirmed  radiographically.  I measured and selected a 105 mm lag screw.  We drilled for lag screw, then placed in a lag screw, which had excellent subchondral bone fixation.  I then took traction off grossly and used the compression wheel and medialized the  shaft to the fracture.  Once this was done, I locked the locking bolt and backed it off a quarter of a turn for further compression with weightbearing.  Once this was done, we used the insertion guide again to place a distal interlock measuring 38 mm.   Once this was done, the jig was removed.  Final radiographs were obtained.  The wounds were irrigated with normal saline solution.  The proximal wound was closed in layers using #1  Vicryl on the gluteal fascia.  All remaining wounds were closed in layers  with 2-0 Vicryl and a running Monocryl stitch.  The hips were then cleaned, dried and dressed sterilely using surgical glue and Aquacel dressing.  He was then extubated and brought to the recovery room in  stable condition, tolerating the procedure well.   Findings were reviewed with his wife.  We will allow him to be weightbearing as tolerated with his right lower extremity based on fixation.  We will see him back in the office in 2 weeks for wound evaluation and radiographic followup.  HN/NUANCE  D:01/12/2020 T:01/12/2020 JOB:[id number]

## 2020-01-12 NOTE — Progress Notes (Signed)
     Subjective: Day of Surgery Procedure(s) (LRB): INTRAMEDULLARY (IM) NAIL FEMORAL (Right)   Patient reports pain as severe with any movement of the hip.  Used a brush on his teeth this morning and since then has felt nausea.  Otherwise the patient understands that surgery is scheduled for later today.    Objective:   VITALS:   Vitals:   01/12/20 0219 01/12/20 0538  BP: (!) 142/54 (!) 125/93  Pulse: 80 81  Resp: 16 16  Temp:  97.7 F (36.5 C)  SpO2:  97%    Neurovascular intact Dorsiflexion/Plantar flexion intact No cellulitis present Compartment soft  LABS Recent Labs    01/10/20 2114 01/11/20 1128 01/12/20 0414  HGB 12.8* 10.6* 9.6*  HCT 38.3* 32.4* 30.0*  WBC 11.7* 10.2 10.1  PLT 159 132* 120*    Recent Labs    01/10/20 2114 01/11/20 1128 01/12/20 0414  NA 140  --  140  K 5.1  --  4.3  BUN 42*  --  33*  CREATININE 1.82* 1.67* 1.63*  GLUCOSE 118*  --  152*     Assessment/Plan: Day of Surgery Procedure(s) (LRB): INTRAMEDULLARY (IM) NAIL FEMORAL (Right)   NPO Plan for IM nail later today per Dr. Charlann Boxer Zofran IV ordered through the nurse        Lanney Gins PA-C  [facility]  [place] [address], [address], [place], [place] [address] Phone: [phone] [email or web address] Facebook  [employer]

## 2020-01-12 NOTE — Brief Op Note (Signed)
01/10/2020 - 01/12/2020  6:12 PM  PATIENT:  Jacob [name]  86 y.o. male  PRE-OPERATIVE DIAGNOSIS:  right intertrochanteric hip fracture  POST-OPERATIVE DIAGNOSIS: right intertrochanteric hip fracture   PROCEDURE:  Procedure(s): INTRAMEDULLARY (IM) NAIL FEMORAL (Right)  SURGEON:  Surgeon(s) and Role:    Durene Romans, MD - Primary  PHYSICIAN ASSISTANT: Lanney Gins, PA-C  ANESTHESIA:   general  EBL:  <150 cc   BLOOD ADMINISTERED:none  DRAINS: none   LOCAL MEDICATIONS USED:  NONE  SPECIMEN:  No Specimen  DISPOSITION OF SPECIMEN:  N/A  COUNTS:  YES  TOURNIQUET:  * No tourniquets in log *  DICTATION: .Other Dictation: Dictation Number [id number]  PLAN OF CARE: Admit to inpatient   PATIENT DISPOSITION:  PACU - hemodynamically stable.   Delay start of Pharmacological VTE agent (>24hrs) due to surgical blood loss or risk of bleeding: no

## 2020-01-12 NOTE — Anesthesia Preprocedure Evaluation (Signed)
Anesthesia Evaluation  Patient identified by MRN, date of birth, ID band Patient awake    Reviewed: Allergy & Precautions, H&P , NPO status , Patient's Chart, lab work & pertinent test results  Airway Mallampati: II  TM Distance: >3 FB Neck ROM: Full    Dental no notable dental hx.    Pulmonary neg pulmonary ROS, former smoker,    Pulmonary exam normal breath sounds clear to auscultation       Cardiovascular hypertension, + CAD and + CABG  Normal cardiovascular exam Rhythm:Regular Rate:Normal     Neuro/Psych negative neurological ROS  negative psych ROS   GI/Hepatic negative GI ROS, Neg liver ROS,   Endo/Other  negative endocrine ROS  Renal/GU negative Renal ROS  negative genitourinary   Musculoskeletal negative musculoskeletal ROS (+)   Abdominal   Peds negative pediatric ROS (+)  Hematology negative hematology ROS (+)   Anesthesia Other Findings   Reproductive/Obstetrics negative OB ROS                             Anesthesia Physical Anesthesia Plan  ASA: III  Anesthesia Plan: General   Post-op Pain Management:    Induction: Intravenous  PONV Risk Score and Plan: 2 and Ondansetron, Dexamethasone and Treatment may vary due to age or medical condition  Airway Management Planned: Oral ETT  Additional Equipment:   Intra-op Plan:   Post-operative Plan:   Informed Consent: I have reviewed the patients History and Physical, chart, labs and discussed the procedure including the risks, benefits and alternatives for the proposed anesthesia with the patient or authorized representative who has indicated his/her understanding and acceptance.     Dental advisory given  Plan Discussed with: CRNA and Surgeon  Anesthesia Plan Comments:         Anesthesia Quick Evaluation

## 2020-01-12 NOTE — Transfer of Care (Signed)
Immediate Anesthesia Transfer of Care Note  Patient: Jacob [name]  Procedure(s) Performed: INTRAMEDULLARY (IM) NAIL FEMORAL (Right )  Patient Location: PACU  Anesthesia Type:General  Level of Consciousness: sedated and responds to stimulation  Airway & Oxygen Therapy: Patient Spontanous Breathing and Patient connected to face mask oxygen  Post-op Assessment: Report given to RN and Post -op Vital signs reviewed and stable  Post vital signs: Reviewed and stable  Last Vitals:  Vitals Value Taken Time  BP    Temp    Pulse 68 01/12/20 1928  Resp 14 01/12/20 1928  SpO2 100 % 01/12/20 1928  Vitals shown include unvalidated device data.  Last Pain:  Vitals:   01/12/20 1631  TempSrc: Oral  PainSc:       Patients Stated Pain Goal: 0 (01/12/20 0123)  Complications: No complications documented.

## 2020-01-12 NOTE — Anesthesia Procedure Notes (Signed)
Procedure Name: Intubation Performed by: Gean Maidens, CRNA Pre-anesthesia Checklist: Patient identified, Emergency Drugs available, Suction available, Patient being monitored and Timeout performed Patient Re-evaluated:Patient Re-evaluated prior to induction Oxygen Delivery Method: Circle system utilized Preoxygenation: Pre-oxygenation with 100% oxygen Induction Type: IV induction Ventilation: Mask ventilation without difficulty Laryngoscope Size: Mac and 4 Grade View: Grade I Tube type: Oral Tube size: 7.5 mm Number of attempts: 1 Airway Equipment and Method: Stylet Placement Confirmation: ETT inserted through vocal cords under direct vision,  positive ETCO2 and breath sounds checked- equal and bilateral Secured at: 23 cm Tube secured with: Tape Dental Injury: Teeth and Oropharynx as per pre-operative assessment

## 2020-01-12 NOTE — Progress Notes (Signed)
PROGRESS NOTE  Jacob [name] NWG:[id number] DOB: 08/04/1924 DOA: 01/10/2020 PCP: Malka So., MD   LOS: 1 day   Brief Narrative / Interim history: Pleasant 86 year old male with history of HTN, CKD 3B, PVD, CAD with history of CABG came to the hospital after a fall complaining of right hip pain.  He was found to have fracture on the x-ray, orthopedic surgery consulted and he was admitted to the hospital.  No syncope, no chest pain, this is a mechanical fall, he was trying to sit in the chair when his cat jumped off of it and surprised him and lost balance.  Subjective / 24h Interval events: He is doing okay this morning, denies any chest pain, denies any shortness of breath.  Assessment & Plan: Principal Problem Right hip fracture -Orthopedic surgery consulted, he will be taken to the OR today.  PT tomorrow  Active Problems Essential hypertension -He is hypertensive this morning, use hydralazine PRN, will resume his home medications after surgery.  Pain also contributing.  Hyperlipidemia -continue statin  CAD/history of CABG -Continue aspirin, Coreg, pravastatin.  No chest pain  Chronic kidney disease stage IIIb -Follows up with [facility] nephrology, his baseline creatinine is around 1.6.  Monitor perioperatively, hold ARB  Questionable A. Fib -He is sinus on the telemetry monitor.  His EKGs done here also show sinus  Macrocytic anemia, thrombocytopenia -We will check folate, B12  Scheduled Meds: . aspirin EC  81 mg Oral Daily  . heparin  5,000 Units Subcutaneous Q8H  . mupirocin ointment  1 application Nasal BID  . ondansetron      . pravastatin  80 mg Oral q1800   Continuous Infusions: . sodium chloride 75 mL/hr at 01/12/20 0047   PRN Meds:.hydrALAZINE, HYDROcodone-acetaminophen, morphine injection, ondansetron (ZOFRAN) IV  Diet Orders (From admission, onward)    Start     Ordered   01/11/20 1304  Diet regular Room service appropriate? Yes; Fluid  consistency: Thin  Diet effective now       Question Answer Comment  Room service appropriate? Yes   Fluid consistency: Thin      01/11/20 1303          DVT prophylaxis: heparin injection 5,000 Units Start: 01/11/20 1400     Code Status: Full Code  Family Communication: No family at bedside  Status is: Inpatient  Remains inpatient appropriate because:Inpatient level of care appropriate due to severity of illness   Dispo: The patient is from: Home              Anticipated d/c is to: SNF              Anticipated d/c date is: 3 days              Patient currently is not medically stable to d/c.  Consultants:  Orthopedic surgery  Procedures:  None   Microbiology  None   Antimicrobials: None     Objective: Vitals:   01/11/20 2016 01/12/20 0123 01/12/20 0219 01/12/20 0538  BP: (!) 196/77 (!) 191/76 (!) 142/54 (!) 125/93  Pulse: 77 79 80 81  Resp:  20 16 16   Temp: 98.1 F (36.7 C)   97.7 F (36.5 C)  TempSrc: Oral   Oral  SpO2: 97%   97%  Weight:      Height:        Intake/Output Summary (Last 24 hours) at 01/12/2020 1012 Last data filed at 01/12/2020 0900 Gross per 24 hour  Intake 2151.62 ml  Output 1250 ml  Net 901.62 ml   Filed Weights   01/10/20 1930  Weight: 67.1 kg    Examination:  Constitutional: NAD Eyes: no scleral icterus ENMT: Mucous membranes are moist.  Neck: normal, supple Respiratory: clear to auscultation bilaterally, no wheezing, no crackles. Normal respiratory effort. Cardiovascular: Regular rate and rhythm, no murmurs / rubs / gallops. No LE edema.  Abdomen: non distended, no tenderness. Bowel sounds positive.  Musculoskeletal: no clubbing / cyanosis.  Skin: no rashes Neurologic: CN 2-12 grossly intact. Strength 5/5 in all 4.    Data Reviewed: I have independently reviewed following labs and imaging studies   CBC: Recent Labs  Lab 01/10/20 2114 01/11/20 1128 01/12/20 0414  WBC 11.7* 10.2 10.1  NEUTROABS 6.9  --    --   HGB 12.8* 10.6* 9.6*  HCT 38.3* 32.4* 30.0*  MCV 100.0 101.6* 103.4*  PLT 159 132* 120*   Basic Metabolic Panel: Recent Labs  Lab 01/10/20 2114 01/11/20 1128 01/12/20 0414  NA 140  --  140  K 5.1  --  4.3  CL 105  --  108  CO2 24  --  20*  GLUCOSE 118*  --  152*  BUN 42*  --  33*  CREATININE 1.82* 1.67* 1.63*  CALCIUM 8.9  --  7.9*   Liver Function Tests: Recent Labs  Lab 01/10/20 2114  AST 22  ALT 17  ALKPHOS 55  BILITOT 0.3  PROT 6.6  ALBUMIN 3.9   Coagulation Profile: No results for input(s): INR, PROTIME in the last 168 hours. HbA1C: No results for input(s): HGBA1C in the last 72 hours. CBG: No results for input(s): GLUCAP in the last 168 hours.  Recent Results (from the past 240 hour(s))  Resp Panel by RT-PCR (Flu A&B, Covid) Nasopharyngeal Swab     Status: None   Collection Time: 01/10/20  9:20 PM   Specimen: Nasopharyngeal Swab; Nasopharyngeal(NP) swabs in vial transport medium  Result Value Ref Range Status   SARS Coronavirus 2 by RT PCR NEGATIVE NEGATIVE Final    Comment: (NOTE) SARS-CoV-2 target nucleic acids are NOT DETECTED.  The SARS-CoV-2 RNA is generally detectable in upper respiratory specimens during the acute phase of infection. The lowest concentration of SARS-CoV-2 viral copies this assay can detect is 138 copies/mL. A negative result does not preclude SARS-Cov-2 infection and should not be used as the sole basis for treatment or other patient management decisions. A negative result may occur with  improper specimen collection/handling, submission of specimen other than nasopharyngeal swab, presence of viral mutation(s) within the areas targeted by this assay, and inadequate number of viral copies(<138 copies/mL). A negative result must be combined with clinical observations, patient history, and epidemiological information. The expected result is Negative.  Fact Sheet for Patients:   BloggerCourse.com  Fact Sheet for Healthcare Providers:  SeriousBroker.it  This test is no t yet approved or cleared by the [place] FDA and  has been authorized for detection and/or diagnosis of SARS-CoV-2 by FDA under an Emergency Use Authorization (EUA). This EUA will remain  in effect (meaning this test can be used) for the duration of the COVID-19 declaration under Section 564(b)(1) of the Act, 21 U.S.C.section 360bbb-3(b)(1), unless the authorization is terminated  or revoked sooner.       Influenza A by PCR NEGATIVE NEGATIVE Final   Influenza B by PCR NEGATIVE NEGATIVE Final    Comment: (NOTE) The Xpert Xpress SARS-CoV-2/FLU/RSV plus assay is intended as an aid in the  diagnosis of influenza from Nasopharyngeal swab specimens and should not be used as a sole basis for treatment. Nasal washings and aspirates are unacceptable for Xpert Xpress SARS-CoV-2/FLU/RSV testing.  Fact Sheet for Patients: BloggerCourse.com  Fact Sheet for Healthcare Providers: SeriousBroker.it  This test is not yet approved or cleared by the [place] FDA and has been authorized for detection and/or diagnosis of SARS-CoV-2 by FDA under an Emergency Use Authorization (EUA). This EUA will remain in effect (meaning this test can be used) for the duration of the COVID-19 declaration under Section 564(b)(1) of the Act, 21 U.S.C. section 360bbb-3(b)(1), unless the authorization is terminated or revoked.  Performed at [facility], [address], [place], [place] [address]   Surgical PCR screen     Status: None   Collection Time: 01/11/20 11:40 PM   Specimen: Nasal Mucosa; Nasal Swab  Result Value Ref Range Status   MRSA, PCR NEGATIVE NEGATIVE Final   Staphylococcus aureus NEGATIVE NEGATIVE Final    Comment: (NOTE) The Xpert SA Assay (FDA approved for NASAL specimens in patients  72 years of age and older), is one component of a comprehensive surveillance program. It is not intended to diagnose infection nor to guide or monitor treatment. Performed at [facility], [address], [place], [place] [address]      Radiology Studies: No results found.   Pamella Pert, MD, PhD [facility]  Between 7 am - 7 pm I am available, please contact me via Amion or Securechat  Between 7 pm - 7 am I am not available, please contact night coverage MD/APP via Amion

## 2020-01-12 NOTE — Progress Notes (Signed)
Initial Nutrition Assessment  DOCUMENTATION CODES:   Non-severe (moderate) malnutrition in context of chronic illness  INTERVENTION:   Ensure Enlive po BID, each supplement provides 350 kcal and 20 grams of protein  PROSource Plus 30 mL BID, each supplement provides 100 kcals and 15 grams of protein  MVI with minerals   NUTRITION DIAGNOSIS:   Moderate Malnutrition related to chronic illness (CKD 3, CAD) as evidenced by moderate muscle depletion,mild fat depletion,mild muscle depletion,severe fat depletion.  GOAL:   Patient will meet greater than or equal to 90% of their needs  MONITOR:   PO intake,Supplement acceptance,Labs,Skin  REASON FOR ASSESSMENT:   Consult Hip fracture protocol  ASSESSMENT:   Pt is a 86 y.o. male with PMH of HTN, CKD 3B, PVD, and CAD with hx of CABG. He presents with R hip fracture, to be taken to OR today.  Spoke with pt at bedside. Pt stated he eats 3 meals a day at home PTA. Typical intake: B: cereal with milk and juice with his daily medications, L: two ham and cheese sandwiches and tea, D: soup. Pt stated that lunch is usually his largest meal of the day. Pt stated he was hungry and thirsty during RD visit and that he did not get breakfast. Pt stated he would like some Raisin Bran during RD visit. RD ordered Raisin Bran cereal, whole milk, scrambled eggs, peaches, and sweet tea for pt lunch. Pt denies taking any oral nutrition supplements at home. Per chart review, pt has consumed 50% of last two reported meals. Pt now NPO status for upcoming surgery.   Pt stated he has experienced a 5 lb unintentional weight loss recently. Per chart review, pt weighed 70.6 kg in October at an outpatient visit with his Cardiologist. Upon this admission, pt weighs 67.1 kg, indicating a 3.5 kg loss, or a 5% weight loss within two months. Pt stated he is still mobile at home and does not use any ambulatory devices  for assistance.   Labs reviewed: CBG's x 24 hours:  118-152  Medications reviewed and include: Pravachol, Heparin   NUTRITION - FOCUSED PHYSICAL EXAM:  Flowsheet Row Most Recent Value  Orbital Region No depletion  Upper Arm Region Severe depletion  Thoracic and Lumbar Region Mild depletion  Buccal Region Moderate depletion  Temple Region Mild depletion  Clavicle Bone Region Moderate depletion  Clavicle and Acromion Bone Region Moderate depletion  Scapular Bone Region Moderate depletion  Dorsal Hand Moderate depletion  Patellar Region Moderate depletion  Anterior Thigh Region Moderate depletion  Posterior Calf Region Moderate depletion  Edema (RD Assessment) None  Hair Reviewed  Eyes Reviewed  Mouth Reviewed  Skin Reviewed  Nails Reviewed       Diet Order:   Diet Order            Diet NPO time specified  Diet effective now                 EDUCATION NEEDS:   Education needs have been addressed  Skin:  Skin Assessment: Reviewed RN Assessment  Last BM:  01/10/20  Height:   Ht Readings from Last 1 Encounters:  01/10/20 5\' 7"  (1.702 m)    Weight:   Wt Readings from Last 1 Encounters:  01/10/20 67.1 kg    Ideal Body Weight:  67.3 kg  BMI:  Body mass index is 23.18 kg/m.  Estimated Nutritional Needs:   Kcal:  1700-1900 kcals  Protein:  95-115 grams  Fluid:  >1.7 L/day  Ronnald Nian, Dietetic Intern Pager: [phone] If unavailable: [phone]

## 2020-01-12 NOTE — Progress Notes (Signed)
PHARMACY NOTE:  ANTIMICROBIAL RENAL DOSAGE ADJUSTMENT  Current antimicrobial regimen includes a mismatch between antimicrobial dosage and estimated renal function.  As per policy approved by the Pharmacy & Therapeutics and Medical Executive Committees, the antimicrobial dosage will be adjusted accordingly.  Current antimicrobial dosage: Cefazolin 2g IV q6h x 2 doses post-operatively   Indication: surgical prophylaxis  Renal Function:  Estimated Creatinine Clearance: 26.5 mL/min (A) (by C-G formula based on SCr of 1.63 mg/dL (H)). []      On intermittent HD, scheduled: []      On CRRT    Antimicrobial dosage has been changed to:  Cefazolin 1g IV q12h x 2 doses post-operatively    Thank you for allowing pharmacy to be a part of this patient's care.  , [facility] 01/12/2020 9:45 PM

## 2020-01-12 NOTE — Plan of Care (Signed)
  Problem: Education: Goal: Knowledge of General Education information will improve Description: Including pain rating scale, medication(s)/side effects and non-pharmacologic comfort measures Outcome: Progressing   Problem: Clinical Measurements: Goal: Ability to maintain clinical measurements within normal limits will improve Outcome: Progressing Goal: Cardiovascular complication will be avoided Outcome: Progressing   Problem: Coping: Goal: Level of anxiety will decrease Outcome: Progressing   Problem: Elimination: Goal: Will not experience complications related to urinary retention Outcome: Progressing   Problem: Pain Managment: Goal: General experience of comfort will improve Outcome: Progressing   Problem: Education: Goal: Verbalization of understanding the information provided (i.e., activity precautions, restrictions, etc) will improve Outcome: Progressing

## 2020-01-13 ENCOUNTER — Encounter (HOSPITAL_COMMUNITY): Payer: Self-pay | Admitting: Orthopedic Surgery

## 2020-01-13 DIAGNOSIS — E440 Moderate protein-calorie malnutrition: Secondary | ICD-10-CM | POA: Insufficient documentation

## 2020-01-13 LAB — COMPREHENSIVE METABOLIC PANEL
ALT: 13 U/L (ref 0–44)
AST: 20 U/L (ref 15–41)
Albumin: 2.9 g/dL — ABNORMAL LOW (ref 3.5–5.0)
Alkaline Phosphatase: 42 U/L (ref 38–126)
Anion gap: 10 (ref 5–15)
BUN: 29 mg/dL — ABNORMAL HIGH (ref 8–23)
CO2: 21 mmol/L — ABNORMAL LOW (ref 22–32)
Calcium: 7.8 mg/dL — ABNORMAL LOW (ref 8.9–10.3)
Chloride: 110 mmol/L (ref 98–111)
Creatinine, Ser: 1.48 mg/dL — ABNORMAL HIGH (ref 0.61–1.24)
GFR, Estimated: 44 mL/min — ABNORMAL LOW (ref >60)
Glucose, Bld: 165 mg/dL — ABNORMAL HIGH (ref 70–99)
Potassium: 4.7 mmol/L (ref 3.5–5.1)
Sodium: 141 mmol/L (ref 135–145)
Total Bilirubin: 0.7 mg/dL (ref 0.3–1.2)
Total Protein: 5.4 g/dL — ABNORMAL LOW (ref 6.5–8.1)

## 2020-01-13 LAB — CBC
HCT: 26.8 % — ABNORMAL LOW (ref 39.0–52.0)
Hemoglobin: 8.5 g/dL — ABNORMAL LOW (ref 13.0–17.0)
MCH: 33.5 pg (ref 26.0–34.0)
MCHC: 31.7 g/dL (ref 30.0–36.0)
MCV: 105.5 fL — ABNORMAL HIGH (ref 80.0–100.0)
Platelets: 114 10*3/uL — ABNORMAL LOW (ref 150–400)
RBC: 2.54 MIL/uL — ABNORMAL LOW (ref 4.22–5.81)
RDW: 13.1 % (ref 11.5–15.5)
WBC: 12.4 10*3/uL — ABNORMAL HIGH (ref 4.0–10.5)
nRBC: 0 % (ref 0.0–0.2)

## 2020-01-13 LAB — IRON AND TIBC
Iron: 16 ug/dL — ABNORMAL LOW (ref 45–182)
Saturation Ratios: 8 % — ABNORMAL LOW (ref 17.9–39.5)
TIBC: 190 ug/dL — ABNORMAL LOW (ref 250–450)
UIBC: 174 ug/dL

## 2020-01-13 LAB — RETICULOCYTES
Immature Retic Fract: 11.9 % (ref 2.3–15.9)
RBC.: 2.58 MIL/uL — ABNORMAL LOW (ref 4.22–5.81)
Retic Count, Absolute: 50.3 10*3/uL (ref 19.0–186.0)
Retic Ct Pct: 2 % (ref 0.4–3.1)

## 2020-01-13 LAB — FERRITIN: Ferritin: 283 ng/mL (ref 24–336)

## 2020-01-13 LAB — FOLATE: Folate: 6.1 ng/mL (ref >5.9)

## 2020-01-13 LAB — VITAMIN B12: Vitamin B-12: 1133 pg/mL — ABNORMAL HIGH (ref 180–914)

## 2020-01-13 MED ORDER — HYDROCODONE-ACETAMINOPHEN 5-325 MG PO TABS
1.0000 | ORAL_TABLET | ORAL | Status: DC | PRN
  Administered 2020-01-13 – 2020-01-15 (×2): 1 via ORAL
  Filled 2020-01-13 (×2): qty 1

## 2020-01-13 NOTE — Plan of Care (Signed)
  Problem: Health Behavior/Discharge Planning: Goal: Ability to manage health-related needs will improve Outcome: Progressing   Problem: Clinical Measurements: Goal: Will remain free from infection Outcome: Progressing Goal: Cardiovascular complication will be avoided Outcome: Progressing   Problem: Elimination: Goal: Will not experience complications related to urinary retention Outcome: Progressing   Problem: Pain Managment: Goal: General experience of comfort will improve Outcome: Progressing   Problem: Education: Goal: Verbalization of understanding the information provided (i.e., activity precautions, restrictions, etc) will improve Outcome: Progressing

## 2020-01-13 NOTE — Evaluation (Signed)
Physical Therapy Evaluation Patient Details Name: Jacob [name] MRN: [id number] DOB: [birth year] Today's Date: 01/13/2020   History of Present Illness  86 year old male with history of HTN, CKD 3B, PVD, CAD with history of CABG came to the hospital after a fall complaining of right hip pain.  He was found to have fracture on the x-ray.  Pt s/p Open reduction internal fixation of right intertrochanteric femur fracture on 01/12/20  Clinical Impression  Patient is s/p above surgery resulting in functional limitations due to the deficits listed below (see PT Problem List).  Patient will benefit from skilled PT to increase their independence and safety with mobility to allow discharge to the venue listed below.  Pt typically ambulatory at home without assistive device.  Pt requiring at least mod assist for mobility at this time and assisted OOB to chair today.  Pt very HOH and sleepy likely from meds earlier however discussed current recommendation for d/c to SNF due to current assist level with spouse and she appears agreeable.  Ortho PA also in at beginning of session.    Follow Up Recommendations SNF    Equipment Recommendations  Rolling walker with 5" wheels    Recommendations for Other Services       Precautions / Restrictions Precautions Precautions: Fall Restrictions Weight Bearing Restrictions: Yes RLE Weight Bearing: Partial weight bearing      Mobility  Bed Mobility Overal bed mobility: Needs Assistance Bed Mobility: Supine to Sit     Supine to sit: Max assist;HOB elevated     General bed mobility comments: multimodal cues for technique, assist for upper and lower body, pt attempting to assist with trunk by utilizing bed rail, required assist to scoot to EOB utilizing bed pad    Transfers Overall transfer level: Needs assistance Equipment used: Rolling walker (2 wheeled) Transfers: Sit to/from [employer] Sit to Stand: Mod assist Stand pivot  transfers: +2 physical assistance;Mod assist       General transfer comment: verbal cues for safe technique, educated pt on PWB status, pt held RW and required rocking technique to rise, performed x3 for strengthening and then pt able to shuffle feet to recliner  Ambulation/Gait             General Gait Details: pt felt unable today despite encouragement  Stairs            Wheelchair Mobility    Modified Rankin (Stroke Patients Only)       Balance Overall balance assessment: Needs assistance         Standing balance support: Bilateral upper extremity supported Standing balance-Leahy Scale: Zero Standing balance comment: reliant on external support                             Pertinent Vitals/Pain Pain Assessment: Faces Faces Pain Scale: Hurts little more Pain Location: right hip Pain Descriptors / Indicators: Grimacing;Sore Pain Intervention(s): Repositioned;Monitored during session;Premedicated before session    Home Living Family/patient expects to be discharged to:: Private residence Living Arrangements: Spouse/significant other   Type of Home: House       Home Layout: One level Home Equipment: None Additional Comments: spouse has her own RW    Prior Function Level of Independence: Independent               Hand Dominance        Extremity/Trunk Assessment   Upper Extremity Assessment Upper Extremity Assessment: Generalized weakness  Lower Extremity Assessment Lower Extremity Assessment: Generalized weakness;RLE deficits/detail RLE Deficits / Details: requiring assist due to pain RLE: Unable to fully assess due to pain       Communication   Communication: HOH  Cognition Arousal/Alertness: Awake/alert Behavior During Therapy: WFL for tasks assessed/performed Overall Cognitive Status: Within Functional Limits for tasks assessed                                 General Comments: hesitant to mobilize  but assisting as able, sleepy at times (RN reports he received a Norco earlier).      General Comments      Exercises     Assessment/Plan    PT Assessment Patient needs continued PT services  PT Problem List Decreased strength;Decreased mobility;Decreased range of motion;Decreased balance;Decreased knowledge of use of DME;Decreased activity tolerance;Decreased safety awareness;Pain       PT Treatment Interventions DME instruction;Gait training;Balance training;Therapeutic exercise;Functional mobility training;Therapeutic activities;Patient/family education    PT Goals (Current goals can be found in the Care Plan section)  Acute Rehab PT Goals PT Goal Formulation: With patient Time For Goal Achievement: 01/27/20 Potential to Achieve Goals: Good    Frequency Min 3X/week   Barriers to discharge        Co-evaluation               AM-PAC PT "6 Clicks" Mobility  Outcome Measure Help needed turning from your back to your side while in a flat bed without using bedrails?: A Lot Help needed moving from lying on your back to sitting on the side of a flat bed without using bedrails?: A Lot Help needed moving to and from a bed to a chair (including a wheelchair)?: A Lot Help needed standing up from a chair using your arms (e.g., wheelchair or bedside chair)?: A Lot Help needed to walk in hospital room?: Total Help needed climbing 3-5 steps with a railing? : Total 6 Click Score: 10    End of Session Equipment Utilized During Treatment: Gait belt Activity Tolerance: Patient tolerated treatment well Patient left: in chair;with call bell/phone within reach;with family/visitor present;with chair alarm set Nurse Communication: Mobility status PT Visit Diagnosis: Other abnormalities of gait and mobility (R26.89)    Time: 1316-1350 PT Time Calculation (min) (ACUTE ONLY): 34 min   Charges:   PT Evaluation $PT Eval Low Complexity: 1 Low PT Treatments $Therapeutic Activity:  8-22 mins      [name] PT, DPT Acute Rehabilitation Services Pager: [phone] Office: [phone]  [name],[name] 01/13/2020, 3:00 PM

## 2020-01-13 NOTE — Progress Notes (Signed)
PROGRESS NOTE  Jacob [name] OZH:[id number] DOB: 1924/12/23 DOA: 01/10/2020 PCP: Malka So., MD   LOS: 2 days   Brief Narrative / Interim history: Pleasant 86 year old male with history of HTN, CKD 3B, PVD, CAD with history of CABG came to the hospital after a fall complaining of right hip pain.  He was found to have fracture on the x-ray, orthopedic surgery consulted and he was admitted to the hospital.  No syncope, no chest pain, this is a mechanical fall, he was trying to sit in the chair when his cat jumped off of it and surprised him and lost balance.  Subjective / 24h Interval events: Doing well today, no chest pain, no shortness of breath.  Had surgery for his hip last night  Assessment & Plan: Principal Problem Right hip fracture -Orthopedic surgery consulted, he was taken to the OR on 4/13 and is status post ORIF with nailing by Dr. Charlann Boxer- -mobilize as able, PT consult today, DVT prophylaxis per orthopedic surgery -Suspect he may need SNF  Active Problems Essential hypertension -Still hypertensive to a certain extent but blood pressure overall much improved. Continue Coreg, amlodipine  Iron deficiency anemia, postoperative blood loss anemia, thrombocytopenia -Anemia panel with iron deficiency, continue iron.  Monitor CBC, no need for transfusion currently.  Low platelets likely consumptive  Hyperlipidemia -continue statin  CAD/history of CABG -Continue aspirin, Coreg, pravastatin.  No chest pain  Chronic kidney disease stage IIIb -Follows up with [facility] nephrology, his baseline creatinine is around 1.6.  Monitor perioperatively, hold ARB -Creatinine now stable at 1.48  Questionable A. Fib -He is sinus on the telemetry monitor.  His EKGs done here also show sinus   Scheduled Meds: . (feeding supplement) PROSource Plus  30 mL Oral BID BM  . amLODipine  5 mg Oral Daily  . aspirin EC  325 mg Oral BID  . carvedilol  6.25 mg Oral BID WC  . Chlorhexidine  Gluconate Cloth  6 each Topical Daily  . docusate sodium  100 mg Oral BID  . feeding supplement  237 mL Oral BID BM  . ferrous sulfate  325 mg Oral TID PC  . heparin  5,000 Units Subcutaneous Q8H  . multivitamin with minerals  1 tablet Oral Daily  . mupirocin ointment  1 application Nasal BID  . pravastatin  80 mg Oral q1800   Continuous Infusions: . sodium chloride 75 mL/hr at 01/12/20 2229  .  ceFAZolin (ANCEF) IV 1 g (01/13/20 0549)   PRN Meds:.acetaminophen, alum & mag hydroxide-simeth, hydrALAZINE, HYDROcodone-acetaminophen, menthol-cetylpyridinium **OR** phenol, methocarbamol **OR** [DISCONTINUED] methocarbamol (ROBAXIN) IV, metoCLOPramide **OR** metoCLOPramide (REGLAN) injection, morphine injection, ondansetron **OR** ondansetron (ZOFRAN) IV  Diet Orders (From admission, onward)    Start     Ordered   01/12/20 2325  Diet clear liquid Room service appropriate? Yes; Fluid consistency: Thin  Diet effective now       Question Answer Comment  Room service appropriate? Yes   Fluid consistency: Thin      01/12/20 2324          DVT prophylaxis: SCDs Start: 01/12/20 1927 Place TED hose Start: 01/12/20 1927 heparin injection 5,000 Units Start: 01/11/20 1400     Code Status: Full Code  Family Communication: No family at bedside  Status is: Inpatient  Remains inpatient appropriate because:Inpatient level of care appropriate due to severity of illness   Dispo: The patient is from: Home              Anticipated  d/c is to: SNF              Anticipated d/c date is: 3 days              Patient currently is not medically stable to d/c.  Consultants:  Orthopedic surgery  Procedures:  None   Microbiology  None   Antimicrobials: None     Objective: Vitals:   01/12/20 2131 01/13/20 0129 01/13/20 0229 01/13/20 0601  BP: (!) 164/62 (!) 173/76 (!) 156/78 (!) 152/63  Pulse: 82 80  84  Resp:  14  16  Temp:  99 F (37.2 C)  98.4 F (36.9 C)  TempSrc:  Axillary  Oral   SpO2: 100%   96%  Weight:      Height:        Intake/Output Summary (Last 24 hours) at 01/13/2020 1038 Last data filed at 01/13/2020 0800 Gross per 24 hour  Intake 2597.68 ml  Output 1005 ml  Net 1592.68 ml   Filed Weights   01/10/20 1930 01/12/20 1631  Weight: 67.1 kg 67.1 kg    Examination:  Constitutional: No distress Eyes: No scleral icterus ENMT: mmm Neck: normal, supple Respiratory: Lungs are clear bilaterally, no wheezing, no crackles.  Normal respiratory effort Cardiovascular: Regular rate and rhythm, no murmurs, no peripheral edema Abdomen: Soft, nontender, nondistended, bowel sounds positive Musculoskeletal: no clubbing / cyanosis.  Skin: No rashes seen Neurologic: Nonfocal   Data Reviewed: I have independently reviewed following labs and imaging studies   CBC: Recent Labs  Lab 01/10/20 2114 01/11/20 1128 01/12/20 0414 01/13/20 0436  WBC 11.7* 10.2 10.1 12.4*  NEUTROABS 6.9  --   --   --   HGB 12.8* 10.6* 9.6* 8.5*  HCT 38.3* 32.4* 30.0* 26.8*  MCV 100.0 101.6* 103.4* 105.5*  PLT 159 132* 120* 114*   Basic Metabolic Panel: Recent Labs  Lab 01/10/20 2114 01/11/20 1128 01/12/20 0414 01/13/20 0436  NA 140  --  140 141  K 5.1  --  4.3 4.7  CL 105  --  108 110  CO2 24  --  20* 21*  GLUCOSE 118*  --  152* 165*  BUN 42*  --  33* 29*  CREATININE 1.82* 1.67* 1.63* 1.48*  CALCIUM 8.9  --  7.9* 7.8*   Liver Function Tests: Recent Labs  Lab 01/10/20 2114 01/13/20 0436  AST 22 20  ALT 17 13  ALKPHOS 55 42  BILITOT 0.3 0.7  PROT 6.6 5.4*  ALBUMIN 3.9 2.9*   Coagulation Profile: No results for input(s): INR, PROTIME in the last 168 hours. HbA1C: No results for input(s): HGBA1C in the last 72 hours. CBG: No results for input(s): GLUCAP in the last 168 hours.  Recent Results (from the past 240 hour(s))  Resp Panel by RT-PCR (Flu A&B, Covid) Nasopharyngeal Swab     Status: None   Collection Time: 01/10/20  9:20 PM   Specimen:  Nasopharyngeal Swab; Nasopharyngeal(NP) swabs in vial transport medium  Result Value Ref Range Status   SARS Coronavirus 2 by RT PCR NEGATIVE NEGATIVE Final    Comment: (NOTE) SARS-CoV-2 target nucleic acids are NOT DETECTED.  The SARS-CoV-2 RNA is generally detectable in upper respiratory specimens during the acute phase of infection. The lowest concentration of SARS-CoV-2 viral copies this assay can detect is 138 copies/mL. A negative result does not preclude SARS-Cov-2 infection and should not be used as the sole basis for treatment or other patient management decisions. A negative result may  occur with  improper specimen collection/handling, submission of specimen other than nasopharyngeal swab, presence of viral mutation(s) within the areas targeted by this assay, and inadequate number of viral copies(<138 copies/mL). A negative result must be combined with clinical observations, patient history, and epidemiological information. The expected result is Negative.  Fact Sheet for Patients:  BloggerCourse.com  Fact Sheet for Healthcare Providers:  SeriousBroker.it  This test is no t yet approved or cleared by the [place] FDA and  has been authorized for detection and/or diagnosis of SARS-CoV-2 by FDA under an Emergency Use Authorization (EUA). This EUA will remain  in effect (meaning this test can be used) for the duration of the COVID-19 declaration under Section 564(b)(1) of the Act, 21 U.S.C.section 360bbb-3(b)(1), unless the authorization is terminated  or revoked sooner.       Influenza A by PCR NEGATIVE NEGATIVE Final   Influenza B by PCR NEGATIVE NEGATIVE Final    Comment: (NOTE) The Xpert Xpress SARS-CoV-2/FLU/RSV plus assay is intended as an aid in the diagnosis of influenza from Nasopharyngeal swab specimens and should not be used as a sole basis for treatment. Nasal washings and aspirates are unacceptable for  Xpert Xpress SARS-CoV-2/FLU/RSV testing.  Fact Sheet for Patients: BloggerCourse.com  Fact Sheet for Healthcare Providers: SeriousBroker.it  This test is not yet approved or cleared by the [place] FDA and has been authorized for detection and/or diagnosis of SARS-CoV-2 by FDA under an Emergency Use Authorization (EUA). This EUA will remain in effect (meaning this test can be used) for the duration of the COVID-19 declaration under Section 564(b)(1) of the Act, 21 U.S.C. section 360bbb-3(b)(1), unless the authorization is terminated or revoked.  Performed at [facility], [address] [place], [place] [address]   Surgical PCR screen     Status: None   Collection Time: 01/11/20 11:40 PM   Specimen: Nasal Mucosa; Nasal Swab  Result Value Ref Range Status   MRSA, PCR NEGATIVE NEGATIVE Final   Staphylococcus aureus NEGATIVE NEGATIVE Final    Comment: (NOTE) The Xpert SA Assay (FDA approved for NASAL specimens in patients 33 years of age and older), is one component of a comprehensive surveillance program. It is not intended to diagnose infection nor to guide or monitor treatment. Performed at [facility], [address], [place], [place] [address]      Radiology Studies: DG C-Arm 1-60 Min-No Report  Result Date: 01/12/2020 Fluoroscopy was utilized by the requesting physician.  No radiographic interpretation.   DG HIP OPERATIVE UNILAT W OR W/O PELVIS RIGHT  Result Date: 01/12/2020 CLINICAL DATA:  Right hip fracture. EXAM: OPERATIVE right HIP (WITH PELVIS IF PERFORMED) 7 VIEWS TECHNIQUE: Fluoroscopic spot image(s) were submitted for interpretation post-operatively. Radiation exposure index: 3.7353 mGy. COMPARISON:  January 10, 2020. FINDINGS: Seven intraoperative fluoroscopic images of the right hip demonstrate surgical internal fixation of intertrochanteric fracture involving the proximal  right femur. Good alignment of fracture components is noted. IMPRESSION: Status post surgical internal fixation of proximal right femoral intertrochanteric fracture. Electronically Signed   By: Lupita Raider M.D.   On: 01/12/2020 19:13     Pamella Pert, MD, PhD [facility]  Between 7 am - 7 pm I am available, please contact me via Amion or Securechat  Between 7 pm - 7 am I am not available, please contact night coverage MD/APP via Amion

## 2020-01-13 NOTE — Progress Notes (Signed)
   Subjective: 1 Day Post-Op Procedure(s) (LRB): INTRAMEDULLARY (IM) NAIL FEMORAL (Right) Patient reports pain as mild.   Patient seen in rounds for Dr. Charlann Boxer. Patient is well, and has had no acute complaints or problems other than discomfort in the right hip. No acute events overnight. Patient is resting in bed with his wife at the bedside. Denies CP, SHOB, N/V.  We will start therapy today.   Objective: Vital signs in last 24 hours: Temp:  [97.6 F (36.4 C)-99 F (37.2 C)] 97.6 F (36.4 C) (12/14 1400) Pulse Rate:  [60-85] 60 (12/14 1400) Resp:  [10-25] 20 (12/14 1400) BP: (98-173)/(49-87) 106/49 (12/14 1400) SpO2:  [84 %-100 %] 94 % (12/14 1400) Weight:  [67.1 kg] 67.1 kg (12/13 1631)  Intake/Output from previous day:  Intake/Output Summary (Last 24 hours) at 01/13/2020 1532 Last data filed at 01/13/2020 0800 Gross per 24 hour  Intake 2597.68 ml  Output 580 ml  Net 2017.68 ml     Intake/Output this shift: Total I/O In: 461.4 [I.V.:411.4; IV Piggyback:50] Out: -   Labs: Recent Labs    01/10/20 2114 01/11/20 1128 01/12/20 0414 01/13/20 0436  HGB 12.8* 10.6* 9.6* 8.5*   Recent Labs    01/12/20 0414 01/13/20 0436  WBC 10.1 12.4*  RBC 2.90* 2.54*  2.58*  HCT 30.0* 26.8*  PLT 120* 114*   Recent Labs    01/12/20 0414 01/13/20 0436  NA 140 141  K 4.3 4.7  CL 108 110  CO2 20* 21*  BUN 33* 29*  CREATININE 1.63* 1.48*  GLUCOSE 152* 165*  CALCIUM 7.9* 7.8*   No results for input(s): LABPT, INR in the last 72 hours.  Exam: General - Patient is Alert and Oriented Extremity - Neurologically intact Sensation intact distally Intact pulses distally Dorsiflexion/Plantar flexion intact Dressing - dressing C/D/I Motor Function - intact, moving foot and toes well on exam.   Past Medical History:  Diagnosis Date  . Coronary artery disease   . Hypertension     Assessment/Plan: 1 Day Post-Op Procedure(s) (LRB): INTRAMEDULLARY (IM) NAIL FEMORAL  (Right) Active Problems:   Hip fracture (HCC)   Malnutrition of moderate degree  Estimated body mass index is 23.17 kg/m as calculated from the following:   Height as of this encounter: 5\' 7"  (1.702 m).   Weight as of this encounter: 67.1 kg. Advance diet Up with therapy  DVT Prophylaxis - Aspirin Weight bearing as tolerated.  Disposition pending physical therapy evaluation and progression with mobility. Patient may be weight bearing as tolerated.   , PA-C Orthopedic Surgery [phone] 01/13/2020, 3:32 PM

## 2020-01-14 DIAGNOSIS — S72001A Fracture of unspecified part of neck of right femur, initial encounter for closed fracture: Secondary | ICD-10-CM

## 2020-01-14 DIAGNOSIS — E440 Moderate protein-calorie malnutrition: Secondary | ICD-10-CM

## 2020-01-14 DIAGNOSIS — W19XXXA Unspecified fall, initial encounter: Secondary | ICD-10-CM

## 2020-01-14 LAB — CBC
HCT: 23.3 % — ABNORMAL LOW (ref 39.0–52.0)
Hemoglobin: 7.4 g/dL — ABNORMAL LOW (ref 13.0–17.0)
MCH: 33.5 pg (ref 26.0–34.0)
MCHC: 31.8 g/dL (ref 30.0–36.0)
MCV: 105.4 fL — ABNORMAL HIGH (ref 80.0–100.0)
Platelets: 122 10*3/uL — ABNORMAL LOW (ref 150–400)
RBC: 2.21 MIL/uL — ABNORMAL LOW (ref 4.22–5.81)
RDW: 13.3 % (ref 11.5–15.5)
WBC: 9.5 10*3/uL (ref 4.0–10.5)
nRBC: 0 % (ref 0.0–0.2)

## 2020-01-14 LAB — BASIC METABOLIC PANEL
Anion gap: 5 (ref 5–15)
BUN: 43 mg/dL — ABNORMAL HIGH (ref 8–23)
CO2: 26 mmol/L (ref 22–32)
Calcium: 7.8 mg/dL — ABNORMAL LOW (ref 8.9–10.3)
Chloride: 110 mmol/L (ref 98–111)
Creatinine, Ser: 1.73 mg/dL — ABNORMAL HIGH (ref 0.61–1.24)
GFR, Estimated: 36 mL/min — ABNORMAL LOW (ref >60)
Glucose, Bld: 133 mg/dL — ABNORMAL HIGH (ref 70–99)
Potassium: 4.8 mmol/L (ref 3.5–5.1)
Sodium: 141 mmol/L (ref 135–145)

## 2020-01-14 MED ORDER — FOLIC ACID 1 MG PO TABS
1.0000 mg | ORAL_TABLET | Freq: Every day | ORAL | Status: DC
Start: 2020-01-14 — End: 2020-01-16
  Administered 2020-01-14 – 2020-01-16 (×3): 1 mg via ORAL
  Filled 2020-01-14 (×3): qty 1

## 2020-01-14 NOTE — Plan of Care (Signed)
  Problem: Education: Goal: Verbalization of understanding the information provided (i.e., activity precautions, restrictions, etc) will improve Outcome: Progressing   Problem: Activity: Goal: Ability to ambulate and perform ADLs will improve Outcome: Progressing   Problem: Safety: Goal: Ability to remain free from injury will improve Outcome: Adequate for Discharge   Problem: Pain Management: Goal: Pain level will decrease Outcome: Adequate for Discharge

## 2020-01-14 NOTE — Plan of Care (Signed)
Patient has been stable this shift as evidence by vital signs and assessment. Patient slept most of the morning and was kind of confused but once he woke he was appropriate. He did eat his dinner but not much else today. Did drink 2 ensures and his protein. Has been using the urinal and had a BM. Safety maintained will continue to monitor. Problem: Education: Goal: Knowledge of General Education information will improve Description: Including pain rating scale, medication(s)/side effects and non-pharmacologic comfort measures Outcome: Progressing   Problem: Health Behavior/Discharge Planning: Goal: Ability to manage health-related needs will improve Outcome: Progressing   Problem: Clinical Measurements: Goal: Ability to maintain clinical measurements within normal limits will improve Outcome: Progressing Goal: Will remain free from infection Outcome: Progressing Goal: Diagnostic test results will improve Outcome: Progressing Goal: Cardiovascular complication will be avoided Outcome: Progressing   Problem: Activity: Goal: Risk for activity intolerance will decrease Outcome: Progressing   Problem: Nutrition: Goal: Adequate nutrition will be maintained Outcome: Progressing   Problem: Coping: Goal: Level of anxiety will decrease Outcome: Progressing   Problem: Elimination: Goal: Will not experience complications related to bowel motility Outcome: Progressing Goal: Will not experience complications related to urinary retention Outcome: Progressing   Problem: Pain Managment: Goal: General experience of comfort will improve Outcome: Progressing   Problem: Safety: Goal: Ability to remain free from injury will improve Outcome: Progressing   Problem: Skin Integrity: Goal: Risk for impaired skin integrity will decrease Outcome: Progressing   Problem: Education: Goal: Verbalization of understanding the information provided (i.e., activity precautions, restrictions, etc) will  improve Outcome: Progressing Goal: Individualized Educational Video(s) Outcome: Progressing   Problem: Activity: Goal: Ability to ambulate and perform ADLs will improve Outcome: Progressing   Problem: Clinical Measurements: Goal: Postoperative complications will be avoided or minimized Outcome: Progressing   Problem: Self-Concept: Goal: Ability to maintain and perform role responsibilities to the fullest extent possible will improve Outcome: Progressing   Problem: Pain Management: Goal: Pain level will decrease Outcome: Progressing

## 2020-01-14 NOTE — Progress Notes (Addendum)
Pt foley D/C'd during AM shift 12/14 1130. Pt only voided 30 ml since. Bladder scan performed with 52 ml residual. MD made aware.

## 2020-01-14 NOTE — Anesthesia Postprocedure Evaluation (Signed)
Anesthesia Post Note  Patient: Jacob [name]  Procedure(s) Performed: INTRAMEDULLARY (IM) NAIL FEMORAL (Right )     Patient location during evaluation: PACU Anesthesia Type: General Level of consciousness: awake and alert Pain management: pain level controlled Vital Signs Assessment: post-procedure vital signs reviewed and stable Respiratory status: spontaneous breathing, nonlabored ventilation, respiratory function stable and patient connected to nasal cannula oxygen Cardiovascular status: blood pressure returned to baseline and stable Postop Assessment: no apparent nausea or vomiting Anesthetic complications: no   No complications documented.  Last Vitals:  Vitals:   01/14/20 0412 01/14/20 0915  BP: (!) 139/56 131/62  Pulse: 73 75  Resp: 14 17  Temp: 37 C 36.9 C  SpO2: 97% 95%    Last Pain:  Vitals:   01/14/20 0915  TempSrc: Oral  PainSc: 0-No pain                 Tracer Gutridge S

## 2020-01-14 NOTE — Care Management Important Message (Signed)
Important Message  Patient Details IM Letter given to the Patient. Name: Jacob [name] MRN: [id number] Date of Birth: May 16, 1924   Medicare Important Message Given:  Yes     Caren Macadam 01/14/2020, 11:16 AM

## 2020-01-14 NOTE — Progress Notes (Signed)
Patient ID: Jacob [name], male   DOB: Feb 28, 1924, 86 y.o.   MRN: [id number] Subjective: 2 Days Post-Op Procedure(s) (LRB): INTRAMEDULLARY (IM) NAIL FEMORAL (Right)    Patient reports pain as mild.  Not much reported activity. Worried about getting better.  No events reported or noted  Objective:   VITALS:   Vitals:   01/13/20 2206 01/14/20 0412  BP: (!) 124/48 (!) 139/56  Pulse: 60 73  Resp: 13 14  Temp: 97.7 F (36.5 C) 98.6 F (37 C)  SpO2: 94% 97%    Neurovascular intact Incision: dressing C/D/I  LABS Recent Labs    01/12/20 0414 01/13/20 0436 01/14/20 0504  HGB 9.6* 8.5* 7.4*  HCT 30.0* 26.8* 23.3*  WBC 10.1 12.4* 9.5  PLT 120* 114* 122*    Recent Labs    01/12/20 0414 01/13/20 0436 01/14/20 0504  NA 140 141 141  K 4.3 4.7 4.8  BUN 33* 29* 43*  CREATININE 1.63* 1.48* 1.73*  GLUCOSE 152* 165* 133*    No results for input(s): LABPT, INR in the last 72 hours.   Assessment/Plan: 2 Days Post-Op Procedure(s) (LRB): INTRAMEDULLARY (IM) NAIL FEMORAL (Right)   Advance diet Up with therapy  WBAT RLE Anemia most likely related to chronic disease as intra-operative blood loss <100cc Treat with IRON supp as tolerable RTC in 2 weeks for wound check and X-rays

## 2020-01-14 NOTE — NC FL2 (Signed)
[place] MEDICAID FL2 LEVEL OF CARE SCREENING TOOL     IDENTIFICATION  Patient Name: Jacob [name] Birthdate: 07-21-1924 Sex: male Admission Date (Current Location): 01/10/2020  [facility] and [place] Number:  Producer, television/film/video and Address:  [facility],  [address], [place] [address]      Provider Number: [id number]  Attending Physician Name and Address:  Arnetha Courser, MD  Relative Name and Phone Number:  Aggie Cosier daughter [phone]    Current Level of Care: Hospital Recommended Level of Care: Skilled Nursing Facility Prior Approval Number:    Date Approved/Denied:   PASRR Number: [id number]  Discharge Plan: SNF    Current Diagnoses: Patient Active Problem List   Diagnosis Date Noted  . Malnutrition of moderate degree 01/13/2020  . Hip fracture (HCC) 01/10/2020    Orientation RESPIRATION BLADDER Height & Weight     Self,Time,Situation,Place  Normal Continent Weight: 67.1 kg Height:  5\' 7"  (170.2 cm)  BEHAVIORAL SYMPTOMS/MOOD NEUROLOGICAL BOWEL NUTRITION STATUS      Incontinent Diet (Regular)  AMBULATORY STATUS COMMUNICATION OF NEEDS Skin   Extensive Assist Verbally Surgical wounds,Other (Comment) (Right hip surgical wound, Right Wrist, Arm Ecchymosis)                       Personal Care Assistance Level of Assistance  Bathing,Feeding,Dressing Bathing Assistance: Maximum assistance Feeding assistance: Independent Dressing Assistance: Maximum assistance     Functional Limitations Info  Sight,Hearing,Speech Sight Info: Impaired Hearing Info: Impaired (Hearing AID) Speech Info: Adequate    SPECIAL CARE FACTORS FREQUENCY  PT (By licensed PT),OT (By licensed OT)     PT Frequency: x5 week OT Frequency: x5 week            Contractures Contractures Info: Not present    Additional Factors Info  Code Status,Allergies Code Status Info: FULL Allergies Info: No Known Allergies           Current Medications  (01/14/2020):  This is the current hospital active medication list Current Facility-Administered Medications  Medication Dose Route Frequency Provider Last Rate Last Admin  . (feeding supplement) PROSource Plus liquid 30 mL  30 mL Oral BID BM 01/16/2020, MD   30 mL at 01/14/20 1221  . acetaminophen (TYLENOL) tablet 325-650 mg  325-650 mg Oral Q6H PRN 01/16/20, PA-C      . alum & mag hydroxide-simeth (MAALOX/MYLANTA) 200-200-20 MG/5ML suspension 30 mL  30 mL Oral Q4H PRN Babish, Matthew, PA-C      . amLODipine (NORVASC) tablet 5 mg  5 mg Oral Daily 02-27-1978, PA-C   5 mg at 01/14/20 01/16/20  . aspirin EC tablet 325 mg  325 mg Oral BID 8756, PA-C   325 mg at 01/14/20 01/16/20  . carvedilol (COREG) tablet 6.25 mg  6.25 mg Oral BID WC 4332, PA-C   6.25 mg at 01/14/20 01/16/20  . Chlorhexidine Gluconate Cloth 2 % PADS 6 each  6 each Topical Daily 9518, MD   6 each at 01/14/20 [phone]  . docusate sodium (COLACE) capsule 100 mg  100 mg Oral BID 8416, PA-C   100 mg at 01/14/20 01/16/20  . feeding supplement (ENSURE ENLIVE / ENSURE PLUS) liquid 237 mL  237 mL Oral BID BM 6063, MD   237 mL at 01/14/20 0918  . ferrous sulfate tablet 325 mg  325 mg Oral TID PC 01/16/20, PA-C   325 mg at 01/14/20 1221  .  folic acid (FOLVITE) tablet 1 mg  1 mg Oral Daily Arnetha Courser, MD   1 mg at 01/14/20 0918  . heparin injection 5,000 Units  5,000 Units Subcutaneous Q8H Leatha Gilding, MD   5,000 Units at 01/14/20 0602  . hydrALAZINE (APRESOLINE) tablet 25 mg  25 mg Oral Q8H PRN Lanney Gins, PA-C   25 mg at 01/13/20 0135  . HYDROcodone-acetaminophen (NORCO/VICODIN) 5-325 MG per tablet 1-2 tablet  1-2 tablet Oral Q4H PRN Lanney Gins, PA-C   1 tablet at 01/13/20 1155  . menthol-cetylpyridinium (CEPACOL) lozenge 3 mg  1 lozenge Oral PRN Lanney Gins, PA-C       Or  . phenol (CHLORASEPTIC) mouth spray 1 spray  1 spray Mouth/Throat PRN Babish,  Matthew, PA-C      . methocarbamol (ROBAXIN) tablet 500 mg  500 mg Oral Q6H PRN Babish, Matthew, PA-C      . metoCLOPramide (REGLAN) tablet 5 mg  5 mg Oral Q8H PRN Lanney Gins, PA-C       Or  . metoCLOPramide (REGLAN) injection 5 mg  5 mg Intravenous Q8H PRN Lanney Gins, PA-C      . morphine 2 MG/ML injection 0.5-1 mg  0.5-1 mg Intravenous Q2H PRN Lanney Gins, PA-C      . multivitamin with minerals tablet 1 tablet  1 tablet Oral Daily Leatha Gilding, MD   1 tablet at 01/14/20 0917  . mupirocin ointment (BACTROBAN) 2 % 1 application  1 application Nasal BID Lanney Gins, PA-C   1 application at 01/14/20 5035  . ondansetron (ZOFRAN) tablet 4 mg  4 mg Oral Q6H PRN Lanney Gins, PA-C       Or  . ondansetron [facility]) injection 4 mg  4 mg Intravenous Q6H PRN Babish, Molli Hazard, PA-C      . pravastatin (PRAVACHOL) tablet 80 mg  80 mg Oral q1800 Babish, Matthew, PA-C   80 mg at 01/13/20 1747     Discharge Medications: Please see discharge summary for a list of discharge medications.  Relevant Imaging Results:  Relevant Lab Results:   Additional Information SS#[id number]  Geni Bers, RN

## 2020-01-14 NOTE — Progress Notes (Signed)
Repeat bladder scan after pt voided 50 ml. Post void residual of 147 ml. MD made aware.

## 2020-01-14 NOTE — Progress Notes (Signed)
PROGRESS NOTE    Jacob [name]  XWR:[id number] DOB: 09-Jan-1925 DOA: 01/10/2020 PCP: Malka SoJobe, Daniel B., MD   Brief Narrative: Taken from prior notes Pleasant 86 year old male with history of HTN, CKD 3B, PVD, CAD with history of CABG came to the hospital after a fall complaining of right hip pain.  He was found to have fracture on the x-ray, orthopedic surgery consulted and he was admitted to the hospital.  No syncope, no chest pain, this is a mechanical fall, he was trying to sit in the chair when his cat jumped off of it and surprised him and lost balance. Orthopedic was consulted and he was taken to the OR for ORIF on 01/12/2020.  Tolerated the procedure well.  PT recommending SNF placement.  Subjective: Patient was seen and examined during morning rounds today.  Getting some grooming done.  Denies any pain.  Assessment & Plan:   Active Problems:   Hip fracture (HCC)   Malnutrition of moderate degree  Right hip fracture s/p ORIF.  Patient had a successful ORIF on 01/12/2020. PT is recommending SNF placement-TOC to look for it. Pain well controlled. -Continue with pain management. -Continue with DVT prophylaxis per orthopedic surgery.  Hypertension.  Blood pressure within goal. -Continue home dose of Coreg and amlodipine.  Anemia of chronic disease with some iron deficiency and blood loss for surgery.  Hemoglobin dropped to 7.4 without any other significant bleeding.  He was started on iron supplement postoperatively.  MCV elevated at 105.4, B12 within normal limit and folic acid within lower normal limit. -Start him on folic acid supplement. -Continue with iron supplement. -Continue to monitor-transfuse if below 7.  Thrombocytopenia.  Platelets seems improving. -Continue to monitor  Hyperlipidemia. -Continue with statin.  History of CAD s/p CABG. no acute concern. -Continue aspirin, Coreg and pravastatin.  CKD stage IIIb.  [facility]  Baseline creatinine around 1.6.  Creatinine at 1.73 with BUN of 43 today. -Encourage p.o. hydration. -Continue to monitor.  Objective: Vitals:   01/13/20 2206 01/14/20 0412 01/14/20 0915 01/14/20 1318  BP: (!) 124/48 (!) 139/56 131/62 (!) 113/55  Pulse: 60 73 75 66  Resp: 13 14 17 20   Temp: 97.7 F (36.5 C) 98.6 F (37 C) 98.5 F (36.9 C) 97.6 F (36.4 C)  TempSrc: Oral Oral Oral Oral  SpO2: 94% 97% 95% 95%  Weight:      Height:        Intake/Output Summary (Last 24 hours) at 01/14/2020 1327 Last data filed at 01/14/2020 0850 Gross per 24 hour  Intake 2213.53 ml  Output 380 ml  Net 1833.53 ml   Filed Weights   01/10/20 1930 01/12/20 1631  Weight: 67.1 kg 67.1 kg    Examination:  General exam: Frail elderly man, appears calm and comfortable  Respiratory system: Clear to auscultation. Respiratory effort normal. Cardiovascular system: S1 & S2 heard, RRR. Gastrointestinal system: Soft, nontender, nondistended, bowel sounds positive. Central nervous system: Alert and oriented. No focal neurological deficits. Extremities: No edema, no cyanosis, pulses intact and symmetrical. Psychiatry: Judgement and insight appear normal. Mood & affect appropriate.    DVT prophylaxis: Heparin Code Status: Full Family Communication: Discussed with daughter on phone. Disposition Plan:  Status is: Inpatient  Remains inpatient appropriate because:Inpatient level of care appropriate due to severity of illness   Dispo: The patient is from: Home              Anticipated d/c is to: SNF  Anticipated d/c date is: 1 day              Patient currently is medically stable to d/c.   Consultants:   Orthopedic  Procedures:  ORIF  Antimicrobials:   Data Reviewed: I have personally reviewed following labs and imaging studies  CBC: Recent Labs  Lab 01/10/20 2114 01/11/20 1128 01/12/20 0414 01/13/20 0436 01/14/20 0504  WBC 11.7* 10.2 10.1 12.4* 9.5   NEUTROABS 6.9  --   --   --   --   HGB 12.8* 10.6* 9.6* 8.5* 7.4*  HCT 38.3* 32.4* 30.0* 26.8* 23.3*  MCV 100.0 101.6* 103.4* 105.5* 105.4*  PLT 159 132* 120* 114* 122*   Basic Metabolic Panel: Recent Labs  Lab 01/10/20 2114 01/11/20 1128 01/12/20 0414 01/13/20 0436 01/14/20 0504  NA 140  --  140 141 141  K 5.1  --  4.3 4.7 4.8  CL 105  --  108 110 110  CO2 24  --  20* 21* 26  GLUCOSE 118*  --  152* 165* 133*  BUN 42*  --  33* 29* 43*  CREATININE 1.82* 1.67* 1.63* 1.48* 1.73*  CALCIUM 8.9  --  7.9* 7.8* 7.8*   GFR: Estimated Creatinine Clearance: 24.9 mL/min (A) (by C-G formula based on SCr of 1.73 mg/dL (H)). Liver Function Tests: Recent Labs  Lab 01/10/20 2114 01/13/20 0436  AST 22 20  ALT 17 13  ALKPHOS 55 42  BILITOT 0.3 0.7  PROT 6.6 5.4*  ALBUMIN 3.9 2.9*   No results for input(s): LIPASE, AMYLASE in the last 168 hours. No results for input(s): AMMONIA in the last 168 hours. Coagulation Profile: No results for input(s): INR, PROTIME in the last 168 hours. Cardiac Enzymes: No results for input(s): CKTOTAL, CKMB, CKMBINDEX, TROPONINI in the last 168 hours. BNP (last 3 results) No results for input(s): PROBNP in the last 8760 hours. HbA1C: No results for input(s): HGBA1C in the last 72 hours. CBG: No results for input(s): GLUCAP in the last 168 hours. Lipid Profile: No results for input(s): CHOL, HDL, LDLCALC, TRIG, CHOLHDL, LDLDIRECT in the last 72 hours. Thyroid Function Tests: No results for input(s): TSH, T4TOTAL, FREET4, T3FREE, THYROIDAB in the last 72 hours. Anemia Panel: Recent Labs    01/13/20 0436  VITAMINB12 1,133*  FOLATE 6.1  FERRITIN 283  TIBC 190*  IRON 16*  RETICCTPCT 2.0   Sepsis Labs: No results for input(s): PROCALCITON, LATICACIDVEN in the last 168 hours.  Recent Results (from the past 240 hour(s))  Resp Panel by RT-PCR (Flu A&B, Covid) Nasopharyngeal Swab     Status: None   Collection Time: 01/10/20  9:20 PM   Specimen:  Nasopharyngeal Swab; Nasopharyngeal(NP) swabs in vial transport medium  Result Value Ref Range Status   SARS Coronavirus 2 by RT PCR NEGATIVE NEGATIVE Final    Comment: (NOTE) SARS-CoV-2 target nucleic acids are NOT DETECTED.  The SARS-CoV-2 RNA is generally detectable in upper respiratory specimens during the acute phase of infection. The lowest concentration of SARS-CoV-2 viral copies this assay can detect is 138 copies/mL. A negative result does not preclude SARS-Cov-2 infection and should not be used as the sole basis for treatment or other patient management decisions. A negative result may occur with  improper specimen collection/handling, submission of specimen other than nasopharyngeal swab, presence of viral mutation(s) within the areas targeted by this assay, and inadequate number of viral copies(<138 copies/mL). A negative result must be combined with clinical observations, patient history, and epidemiological  information. The expected result is Negative.  Fact Sheet for Patients:  BloggerCourse.com  Fact Sheet for Healthcare Providers:  SeriousBroker.it  This test is no t yet approved or cleared by the [place] FDA and  has been authorized for detection and/or diagnosis of SARS-CoV-2 by FDA under an Emergency Use Authorization (EUA). This EUA will remain  in effect (meaning this test can be used) for the duration of the COVID-19 declaration under Section 564(b)(1) of the Act, 21 U.S.C.section 360bbb-3(b)(1), unless the authorization is terminated  or revoked sooner.       Influenza A by PCR NEGATIVE NEGATIVE Final   Influenza B by PCR NEGATIVE NEGATIVE Final    Comment: (NOTE) The Xpert Xpress SARS-CoV-2/FLU/RSV plus assay is intended as an aid in the diagnosis of influenza from Nasopharyngeal swab specimens and should not be used as a sole basis for treatment. Nasal washings and aspirates are unacceptable for  Xpert Xpress SARS-CoV-2/FLU/RSV testing.  Fact Sheet for Patients: BloggerCourse.com  Fact Sheet for Healthcare Providers: SeriousBroker.it  This test is not yet approved or cleared by the [place] FDA and has been authorized for detection and/or diagnosis of SARS-CoV-2 by FDA under an Emergency Use Authorization (EUA). This EUA will remain in effect (meaning this test can be used) for the duration of the COVID-19 declaration under Section 564(b)(1) of the Act, 21 U.S.C. section 360bbb-3(b)(1), unless the authorization is terminated or revoked.  Performed at [facility], [address] [place], [place] [address]   Surgical PCR screen     Status: None   Collection Time: 01/11/20 11:40 PM   Specimen: Nasal Mucosa; Nasal Swab  Result Value Ref Range Status   MRSA, PCR NEGATIVE NEGATIVE Final   Staphylococcus aureus NEGATIVE NEGATIVE Final    Comment: (NOTE) The Xpert SA Assay (FDA approved for NASAL specimens in patients 69 years of age and older), is one component of a comprehensive surveillance program. It is not intended to diagnose infection nor to guide or monitor treatment. Performed at [facility], [address] [place], [place] [address]      Radiology Studies: DG C-Arm 1-60 Min-No Report  Result Date: 01/12/2020 CLINICAL DATA:  Right hip fracture. EXAM: OPERATIVE right HIP (WITH PELVIS IF PERFORMED) 7 VIEWS TECHNIQUE: Fluoroscopic spot image(s) were submitted for interpretation post-operatively. Radiation exposure index: 3.7353 mGy. COMPARISON:  January 10, 2020. FINDINGS: Seven intraoperative fluoroscopic images of the right hip demonstrate surgical internal fixation of intertrochanteric fracture involving the proximal right femur. Good alignment of fracture components is noted. IMPRESSION: Status post surgical internal fixation of proximal right femoral intertrochanteric fracture.  Electronically Signed   By: Lupita Raider M.D.   On: 01/12/2020 19:13   DG HIP OPERATIVE UNILAT W OR W/O PELVIS RIGHT  Result Date: 01/12/2020 CLINICAL DATA:  Right hip fracture. EXAM: OPERATIVE right HIP (WITH PELVIS IF PERFORMED) 7 VIEWS TECHNIQUE: Fluoroscopic spot image(s) were submitted for interpretation post-operatively. Radiation exposure index: 3.7353 mGy. COMPARISON:  January 10, 2020. FINDINGS: Seven intraoperative fluoroscopic images of the right hip demonstrate surgical internal fixation of intertrochanteric fracture involving the proximal right femur. Good alignment of fracture components is noted. IMPRESSION: Status post surgical internal fixation of proximal right femoral intertrochanteric fracture. Electronically Signed   By: Lupita Raider M.D.   On: 01/12/2020 19:13    Scheduled Meds: . (feeding supplement) PROSource Plus  30 mL Oral BID BM  . amLODipine  5 mg Oral Daily  . aspirin EC  325 mg Oral BID  . carvedilol  6.25 mg Oral BID WC  . Chlorhexidine Gluconate Cloth  6 each Topical Daily  . docusate sodium  100 mg Oral BID  . feeding supplement  237 mL Oral BID BM  . ferrous sulfate  325 mg Oral TID PC  . folic acid  1 mg Oral Daily  . heparin  5,000 Units Subcutaneous Q8H  . multivitamin with minerals  1 tablet Oral Daily  . mupirocin ointment  1 application Nasal BID  . pravastatin  80 mg Oral q1800   Continuous Infusions:   LOS: 3 days   Time spent: 35 minutes  Arnetha Courser, MD [facility]  If 7PM-7AM, please contact night-coverage [email or web address]  01/14/2020, 1:27 PM   This record has been created using Conservation officer, historic buildings. Errors have been sought and corrected,but may not always be located. Such creation errors do not reflect on the standard of care.

## 2020-01-14 NOTE — TOC Progression Note (Signed)
Transition of Care [facility]) - Progression Note    Patient Details  Name: Jacob [name] MRN: [id number] Date of Birth: 1924-09-25  [facility]) CM/SW Contact  Geni Bers, RN Phone Number: 01/14/2020, 1:14 PM  Clinical Narrative:     Spoke with pt and daughter concerning discharge to SNF. Spoke with daughter Zella Ball via telephone, who agreed to pt going to SNF.  Will give [facility] bed offers. . Expected Discharge Plan: Skilled Nursing Facility Barriers to Discharge: No Barriers Identified  Expected Discharge Plan and Services Expected Discharge Plan: Skilled Nursing Facility       Living arrangements for the past 2 months: Single Family Home                                       Social Determinants of Health (SDOH) Interventions    Readmission Risk Interventions No flowsheet data found.

## 2020-01-15 LAB — BASIC METABOLIC PANEL
Anion gap: 5 (ref 5–15)
BUN: 53 mg/dL — ABNORMAL HIGH (ref 8–23)
CO2: 24 mmol/L (ref 22–32)
Calcium: 7.8 mg/dL — ABNORMAL LOW (ref 8.9–10.3)
Chloride: 111 mmol/L (ref 98–111)
Creatinine, Ser: 1.61 mg/dL — ABNORMAL HIGH (ref 0.61–1.24)
GFR, Estimated: 40 mL/min — ABNORMAL LOW (ref >60)
Glucose, Bld: 140 mg/dL — ABNORMAL HIGH (ref 70–99)
Potassium: 4.5 mmol/L (ref 3.5–5.1)
Sodium: 140 mmol/L (ref 135–145)

## 2020-01-15 LAB — CBC
HCT: 24.4 % — ABNORMAL LOW (ref 39.0–52.0)
Hemoglobin: 7.7 g/dL — ABNORMAL LOW (ref 13.0–17.0)
MCH: 33.5 pg (ref 26.0–34.0)
MCHC: 31.6 g/dL (ref 30.0–36.0)
MCV: 106.1 fL — ABNORMAL HIGH (ref 80.0–100.0)
Platelets: 148 10*3/uL — ABNORMAL LOW (ref 150–400)
RBC: 2.3 MIL/uL — ABNORMAL LOW (ref 4.22–5.81)
RDW: 13.4 % (ref 11.5–15.5)
WBC: 10.2 10*3/uL (ref 4.0–10.5)
nRBC: 0 % (ref 0.0–0.2)

## 2020-01-15 MED ORDER — ASPIRIN 81 MG PO CHEW: 81.0000 mg | CHEWABLE_TABLET | Freq: Two times a day (BID) | ORAL | 0 refills | Status: AC

## 2020-01-15 MED ORDER — FERROUS SULFATE 325 (65 FE) MG PO TABS
325.0000 mg | ORAL_TABLET | Freq: Three times a day (TID) | ORAL | 0 refills | Status: DC
Start: 2020-01-15 — End: 2020-01-29

## 2020-01-15 MED ORDER — HYDROCODONE-ACETAMINOPHEN 5-325 MG PO TABS: 1.0000 | ORAL_TABLET | Freq: Four times a day (QID) | ORAL | 0 refills | Status: DC | PRN

## 2020-01-15 NOTE — Progress Notes (Signed)
Physical Therapy Treatment Patient Details Name: Jacob [name] MRN: [id number] DOB: 01-10-1925 Today's Date: 01/15/2020    History of Present Illness 86 year old male with history of HTN, CKD 3B, PVD, CAD with history of CABG came to the hospital after a fall complaining of right hip pain.  He was found to have fracture on the x-ray.  Pt s/p Open reduction internal fixation of right intertrochanteric femur fracture on 01/12/20    PT Comments    Pt assisted with standing and able to tolerate short distance ambulation today.  Pt requiring more assist for bed mobility however min assist with ambulating.  Pt encouraged to remain upright in recliner.  (pt reporting chest pain last night and this morning,  ?reflux -- pt with increased belching upon sitting EOB and reported feeling better)  RN was in room as well to assist with hygiene upon pt standing (BM on bed pad).  Continue to recommend SNF upon d/c at this time.    Follow Up Recommendations  SNF     Equipment Recommendations  Rolling walker with 5" wheels    Recommendations for Other Services       Precautions / Restrictions Precautions Precautions: Fall Restrictions Weight Bearing Restrictions: No RLE Weight Bearing: Weight bearing as tolerated Other Position/Activity Restrictions: (new current order is for WBAT)    Mobility  Bed Mobility Overal bed mobility: Needs Assistance Bed Mobility: Supine to Sit     Supine to sit: Max assist;HOB elevated     General bed mobility comments: multimodal cues for technique, assist for upper and lower body, pt attempting to assist with trunk by utilizing bed rail, required assist to scoot to EOB utilizing bed pad  Transfers Overall transfer level: Needs assistance Equipment used: Rolling walker (2 wheeled) Transfers: Sit to/from Stand Sit to Stand: Min assist         General transfer comment: verbal cues for safe technique, pt held RW and required rocking technique to rise,  assist to rise and steady, pt assisted with controlling descent with cues to use armrests  Ambulation/Gait Ambulation/Gait assistance: Min assist Gait Distance (Feet): 30 Feet Assistive device: Rolling walker (2 wheeled) Gait Pattern/deviations: Step-through pattern;Decreased stride length;Narrow base of support;Shuffle     General Gait Details: assist for stability throughout, small shuffling steps with narrow BOS, distance to tolerance   Stairs             Wheelchair Mobility    Modified Rankin (Stroke Patients Only)       Balance Overall balance assessment: Needs assistance         Standing balance support: Bilateral upper extremity supported Standing balance-Leahy Scale: Poor Standing balance comment: reliant on external support                            Cognition Arousal/Alertness: Awake/alert Behavior During Therapy: WFL for tasks assessed/performed Overall Cognitive Status: Within Functional Limits for tasks assessed                                 General Comments: HOH however following simple commands      Exercises      General Comments        Pertinent Vitals/Pain Pain Assessment: Faces Faces Pain Scale: Hurts a little bit Pain Location: right hip Pain Descriptors / Indicators: Grimacing;Sore Pain Intervention(s): Repositioned;Monitored during session    Home Living  Prior Function            PT Goals (current goals can now be found in the care plan section) Progress towards PT goals: Progressing toward goals    Frequency    Min 3X/week      PT Plan Current plan remains appropriate    Co-evaluation              AM-PAC PT "6 Clicks" Mobility   Outcome Measure  Help needed turning from your back to your side while in a flat bed without using bedrails?: A Lot Help needed moving from lying on your back to sitting on the side of a flat bed without using bedrails?: A  Lot Help needed moving to and from a bed to a chair (including a wheelchair)?: A Little Help needed standing up from a chair using your arms (e.g., wheelchair or bedside chair)?: A Little Help needed to walk in hospital room?: A Little Help needed climbing 3-5 steps with a railing? : A Lot 6 Click Score: 15    End of Session Equipment Utilized During Treatment: Gait belt Activity Tolerance: Patient tolerated treatment well Patient left: in chair;with call bell/phone within reach;with chair alarm set Nurse Communication: Mobility status (RN observed) PT Visit Diagnosis: Other abnormalities of gait and mobility (R26.89)     Time: 1008-1030 PT Time Calculation (min) (ACUTE ONLY): 22 min  Charges:  $Gait Training: 8-22 mins                    Paulino Door, DPT Acute Rehabilitation Services Pager: [phone] Office: [phone]  Maida Sale E 01/15/2020, 12:00 PM

## 2020-01-15 NOTE — Progress Notes (Signed)
PRN f/u. Pt sleeping. Awoke pt to assess pain which is now 0/10 after receiving Maalox.

## 2020-01-15 NOTE — Progress Notes (Signed)
Writer notified by central telemetry that pt is SR with trigeminy PVC's 60-70's. Pt is sleeping and asymptomatic. MD made aware via amion.

## 2020-01-15 NOTE — TOC Progression Note (Signed)
Transition of Care [facility]) - Progression Note    Patient Details  Name: Jacob [name] MRN: [id number] Date of Birth: 1925-01-11  [facility]) CM/SW Contact  Geni Bers, RN Phone Number: 01/15/2020, 2:26 PM  Clinical Narrative:    Authorization was started.    Expected Discharge Plan: Skilled Nursing Facility Barriers to Discharge: No Barriers Identified  Expected Discharge Plan and Services Expected Discharge Plan: Skilled Nursing Facility       Living arrangements for the past 2 months: Single Family Home                                       Social Determinants of Health (SDOH) Interventions    Readmission Risk Interventions No flowsheet data found.

## 2020-01-15 NOTE — Plan of Care (Signed)
  Problem: Education: Goal: Knowledge of General Education information will improve Description: Including pain rating scale, medication(s)/side effects and non-pharmacologic comfort measures Outcome: Progressing   Problem: Health Behavior/Discharge Planning: Goal: Ability to manage health-related needs will improve Outcome: Progressing   Problem: Clinical Measurements: Goal: Ability to maintain clinical measurements within normal limits will improve Outcome: Progressing Goal: Will remain free from infection Outcome: Progressing Goal: Diagnostic test results will improve Outcome: Progressing Goal: Cardiovascular complication will be avoided Outcome: Progressing   Problem: Activity: Goal: Risk for activity intolerance will decrease Outcome: Progressing   Problem: Nutrition: Goal: Adequate nutrition will be maintained Outcome: Progressing   Problem: Coping: Goal: Level of anxiety will decrease Outcome: Progressing   Problem: Elimination: Goal: Will not experience complications related to bowel motility Outcome: Progressing Goal: Will not experience complications related to urinary retention Outcome: Progressing   Problem: Pain Managment: Goal: General experience of comfort will improve Outcome: Progressing   Problem: Safety: Goal: Ability to remain free from injury will improve 01/15/2020 0305 by Kerrin Champagne, RN Outcome: Progressing 01/14/2020 1927 by Kerrin Champagne, RN Outcome: Adequate for Discharge   Problem: Skin Integrity: Goal: Risk for impaired skin integrity will decrease Outcome: Progressing   Problem: Education: Goal: Verbalization of understanding the information provided (i.e., activity precautions, restrictions, etc) will improve 01/15/2020 0305 by Kerrin Champagne, RN Outcome: Progressing 01/14/2020 1927 by Kerrin Champagne, RN Outcome: Progressing Goal: Individualized Educational Video(s) Outcome: Progressing   Problem: Activity: Goal: Ability to  ambulate and perform ADLs will improve 01/15/2020 0305 by Kerrin Champagne, RN Outcome: Progressing 01/14/2020 1927 by Kerrin Champagne, RN Outcome: Progressing   Problem: Clinical Measurements: Goal: Postoperative complications will be avoided or minimized Outcome: Progressing   Problem: Self-Concept: Goal: Ability to maintain and perform role responsibilities to the fullest extent possible will improve Outcome: Progressing   Problem: Pain Management: Goal: Pain level will decrease 01/15/2020 0305 by Kerrin Champagne, RN Outcome: Progressing 01/14/2020 1927 by Kerrin Champagne, RN Outcome: Adequate for Discharge

## 2020-01-15 NOTE — Progress Notes (Signed)
PROGRESS NOTE    Jacob [name]  JXB:[id number] DOB: 1924/09/23 DOA: 01/10/2020 PCP: Malka So., MD   Brief Narrative: Taken from prior notes Pleasant 86 year old male with history of HTN, CKD 3B, PVD, CAD with history of CABG came to the hospital after a fall complaining of right hip pain.  He was found to have fracture on the x-ray, orthopedic surgery consulted and he was admitted to the hospital.  No syncope, no chest pain, this is a mechanical fall, he was trying to sit in the chair when his cat jumped off of it and surprised him and lost balance. Orthopedic was consulted and he was taken to the OR for ORIF on 01/12/2020.  Tolerated the procedure well.  PT recommending SNF placement.  Subjective: Patient was sitting comfortably in chair when seen during morning rounds.  Stating there is no pain when he is sitting but to experience some pain with moving around.  He is very hard of hearing.  Assessment & Plan:   Active Problems:   Hip fracture (HCC)   Malnutrition of moderate degree   Fall  Right hip fracture s/p ORIF.  Patient had a successful ORIF on 01/12/2020. PT is recommending SNF placement-TOC to look for it. Pain well controlled. -Continue to work with PT while waiting for rehab. -Continue with pain management. -Continue with DVT prophylaxis per orthopedic surgery.  Hypertension.  Blood pressure mildly elevated today. -Continue home dose of Coreg and amlodipine.  Anemia of chronic disease with some iron deficiency and blood loss for surgery.  Hemoglobin dropped to 7.4 without any other significant bleeding, improved to 7.7 without any intervention today.  He was started on iron supplement postoperatively.  MCV elevated at 105.4, B12 within normal limit and folic acid within lower normal limit. -Start him on folic acid supplement. -Continue with iron supplement. -Continue to monitor-transfuse if below 7.  Thrombocytopenia.  Platelets seems improving. -Continue to  monitor  Hyperlipidemia. -Continue with statin.  History of CAD s/p CABG. no acute concern. -Continue aspirin, Coreg and pravastatin.  CKD stage IIIb.  Patient normally follow-up with [facility] nephrology.  Baseline creatinine around 1.6.  Creatinine at 1.63 with BUN of 53 today. -Encourage p.o. hydration. -Continue to monitor.  Objective: Vitals:   01/14/20 2010 01/15/20 0403 01/15/20 0411 01/15/20 0850  BP: (!) 122/58 (!) 158/66 (!) 153/62 (!) 171/56  Pulse: 62 72 67 68  Resp: 14 16 18 18   Temp: 98.1 F (36.7 C) 97.8 F (36.6 C)    TempSrc: Oral Oral    SpO2: 96% 97% 97% 97%  Weight:      Height:        Intake/Output Summary (Last 24 hours) at 01/15/2020 1405 Last data filed at 01/15/2020 0900 Gross per 24 hour  Intake 720 ml  Output 825 ml  Net -105 ml   Filed Weights   01/10/20 1930 01/12/20 1631  Weight: 67.1 kg 67.1 kg    Examination:  General.  Frail, hard of hearing elderly man, in no acute distress. Pulmonary.  Lungs clear bilaterally, normal respiratory effort. CV.  Regular rate and rhythm, no JVD, rub or murmur. Abdomen.  Soft, nontender, nondistended, BS positive. CNS.  Alert and oriented x3.  No focal neurologic deficit. Extremities.  No edema, no cyanosis, pulses intact and symmetrical. Psychiatry.  Judgment and insight appears normal.   DVT prophylaxis: Heparin Code Status: Full Family Communication: Discussed with daughter on phone. Disposition Plan:  Status is: Inpatient  Remains inpatient appropriate because:Inpatient level  of care appropriate due to severity of illness   Dispo: The patient is from: Home              Anticipated d/c is to: SNF              Anticipated d/c date is: 1 day              Patient currently is medically stable to d/c.   Consultants:   Orthopedic  Procedures:  ORIF  Antimicrobials:   Data Reviewed: I have personally reviewed following labs and imaging studies  CBC: Recent Labs  Lab  01/10/20 2114 01/11/20 1128 01/12/20 0414 01/13/20 0436 01/14/20 0504 01/15/20 0453  WBC 11.7* 10.2 10.1 12.4* 9.5 10.2  NEUTROABS 6.9  --   --   --   --   --   HGB 12.8* 10.6* 9.6* 8.5* 7.4* 7.7*  HCT 38.3* 32.4* 30.0* 26.8* 23.3* 24.4*  MCV 100.0 101.6* 103.4* 105.5* 105.4* 106.1*  PLT 159 132* 120* 114* 122* 148*   Basic Metabolic Panel: Recent Labs  Lab 01/10/20 2114 01/11/20 1128 01/12/20 0414 01/13/20 0436 01/14/20 0504 01/15/20 0453  NA 140  --  140 141 141 140  K 5.1  --  4.3 4.7 4.8 4.5  CL 105  --  108 110 110 111  CO2 24  --  20* 21* 26 24  GLUCOSE 118*  --  152* 165* 133* 140*  BUN 42*  --  33* 29* 43* 53*  CREATININE 1.82* 1.67* 1.63* 1.48* 1.73* 1.61*  CALCIUM 8.9  --  7.9* 7.8* 7.8* 7.8*   GFR: Estimated Creatinine Clearance: 26.8 mL/min (A) (by C-G formula based on SCr of 1.61 mg/dL (H)). Liver Function Tests: Recent Labs  Lab 01/10/20 2114 01/13/20 0436  AST 22 20  ALT 17 13  ALKPHOS 55 42  BILITOT 0.3 0.7  PROT 6.6 5.4*  ALBUMIN 3.9 2.9*   No results for input(s): LIPASE, AMYLASE in the last 168 hours. No results for input(s): AMMONIA in the last 168 hours. Coagulation Profile: No results for input(s): INR, PROTIME in the last 168 hours. Cardiac Enzymes: No results for input(s): CKTOTAL, CKMB, CKMBINDEX, TROPONINI in the last 168 hours. BNP (last 3 results) No results for input(s): PROBNP in the last 8760 hours. HbA1C: No results for input(s): HGBA1C in the last 72 hours. CBG: No results for input(s): GLUCAP in the last 168 hours. Lipid Profile: No results for input(s): CHOL, HDL, LDLCALC, TRIG, CHOLHDL, LDLDIRECT in the last 72 hours. Thyroid Function Tests: No results for input(s): TSH, T4TOTAL, FREET4, T3FREE, THYROIDAB in the last 72 hours. Anemia Panel: Recent Labs    01/13/20 0436  VITAMINB12 1,133*  FOLATE 6.1  FERRITIN 283  TIBC 190*  IRON 16*  RETICCTPCT 2.0   Sepsis Labs: No results for input(s): PROCALCITON,  LATICACIDVEN in the last 168 hours.  Recent Results (from the past 240 hour(s))  Resp Panel by RT-PCR (Flu A&B, Covid) Nasopharyngeal Swab     Status: None   Collection Time: 01/10/20  9:20 PM   Specimen: Nasopharyngeal Swab; Nasopharyngeal(NP) swabs in vial transport medium  Result Value Ref Range Status   SARS Coronavirus 2 by RT PCR NEGATIVE NEGATIVE Final    Comment: (NOTE) SARS-CoV-2 target nucleic acids are NOT DETECTED.  The SARS-CoV-2 RNA is generally detectable in upper respiratory specimens during the acute phase of infection. The lowest concentration of SARS-CoV-2 viral copies this assay can detect is 138 copies/mL. A negative result does not  preclude SARS-Cov-2 infection and should not be used as the sole basis for treatment or other patient management decisions. A negative result may occur with  improper specimen collection/handling, submission of specimen other than nasopharyngeal swab, presence of viral mutation(s) within the areas targeted by this assay, and inadequate number of viral copies(<138 copies/mL). A negative result must be combined with clinical observations, patient history, and epidemiological information. The expected result is Negative.  Fact Sheet for Patients:  BloggerCourse.com  Fact Sheet for Healthcare Providers:  SeriousBroker.it  This test is no t yet approved or cleared by the [place] FDA and  has been authorized for detection and/or diagnosis of SARS-CoV-2 by FDA under an Emergency Use Authorization (EUA). This EUA will remain  in effect (meaning this test can be used) for the duration of the COVID-19 declaration under Section 564(b)(1) of the Act, 21 U.S.C.section 360bbb-3(b)(1), unless the authorization is terminated  or revoked sooner.       Influenza A by PCR NEGATIVE NEGATIVE Final   Influenza B by PCR NEGATIVE NEGATIVE Final    Comment: (NOTE) The Xpert Xpress  SARS-CoV-2/FLU/RSV plus assay is intended as an aid in the diagnosis of influenza from Nasopharyngeal swab specimens and should not be used as a sole basis for treatment. Nasal washings and aspirates are unacceptable for Xpert Xpress SARS-CoV-2/FLU/RSV testing.  Fact Sheet for Patients: BloggerCourse.com  Fact Sheet for Healthcare Providers: SeriousBroker.it  This test is not yet approved or cleared by the [place] FDA and has been authorized for detection and/or diagnosis of SARS-CoV-2 by FDA under an Emergency Use Authorization (EUA). This EUA will remain in effect (meaning this test can be used) for the duration of the COVID-19 declaration under Section 564(b)(1) of the Act, 21 U.S.C. section 360bbb-3(b)(1), unless the authorization is terminated or revoked.  Performed at [facility], [address], [place], [place] [address]   Surgical PCR screen     Status: None   Collection Time: 01/11/20 11:40 PM   Specimen: Nasal Mucosa; Nasal Swab  Result Value Ref Range Status   MRSA, PCR NEGATIVE NEGATIVE Final   Staphylococcus aureus NEGATIVE NEGATIVE Final    Comment: (NOTE) The Xpert SA Assay (FDA approved for NASAL specimens in patients 11 years of age and older), is one component of a comprehensive surveillance program. It is not intended to diagnose infection nor to guide or monitor treatment. Performed at [facility], [address], [place], [place] [address]      Radiology Studies: No results found.  Scheduled Meds: . (feeding supplement) PROSource Plus  30 mL Oral BID BM  . amLODipine  5 mg Oral Daily  . aspirin EC  325 mg Oral BID  . carvedilol  6.25 mg Oral BID WC  . Chlorhexidine Gluconate Cloth  6 each Topical Daily  . docusate sodium  100 mg Oral BID  . feeding supplement  237 mL Oral BID BM  . ferrous sulfate  325 mg Oral TID PC  . folic acid  1 mg Oral Daily  . heparin   5,000 Units Subcutaneous Q8H  . multivitamin with minerals  1 tablet Oral Daily  . mupirocin ointment  1 application Nasal BID  . pravastatin  80 mg Oral q1800   Continuous Infusions:   LOS: 4 days   Time spent: 25 minutes  Arnetha Courser, MD [facility]  If 7PM-7AM, please contact night-coverage [email or web address]  01/15/2020, 2:05 PM   This record has been created  using Systems analyst. Errors have been sought and corrected,but may not always be located. Such creation errors do not reflect on the standard of care.

## 2020-01-15 NOTE — Progress Notes (Addendum)
°   01/15/20 0403  Vitals  Temp 97.8 F (36.6 C)  Temp Source Oral  BP (!) 158/66  MAP (mmHg) 92  BP Method Automatic  Pulse Rate 72  Pulse Rate Source Monitor  Resp 16  MEWS COLOR  MEWS Score Color Green  Oxygen Therapy  SpO2 97 %  MEWS Score  MEWS Temp 0  MEWS Systolic 0  MEWS Pulse 0  MEWS RR 0  MEWS LOC 0  MEWS Score 0   Pt c/o left pressure upper chest pain 4/10. EKG performed. Pt actively belching, administered PRN Maalox. MD notified, awaiting orders.

## 2020-01-15 NOTE — Plan of Care (Signed)
  Problem: Health Behavior/Discharge Planning: Goal: Ability to manage health-related needs will improve Outcome: Progressing   Problem: Clinical Measurements: Goal: Will remain free from infection Outcome: Progressing   Problem: Activity: Goal: Risk for activity intolerance will decrease Outcome: Progressing   Problem: Coping: Goal: Level of anxiety will decrease Outcome: Progressing   Problem: Pain Managment: Goal: General experience of comfort will improve Outcome: Progressing   Problem: Safety: Goal: Ability to remain free from injury will improve Outcome: Progressing   Problem: Skin Integrity: Goal: Risk for impaired skin integrity will decrease Outcome: Progressing   

## 2020-01-16 DIAGNOSIS — Z419 Encounter for procedure for purposes other than remedying health state, unspecified: Secondary | ICD-10-CM

## 2020-01-16 LAB — RESP PANEL BY RT-PCR (FLU A&B, COVID) ARPGX2
Influenza A by PCR: NEGATIVE
Influenza B by PCR: NEGATIVE
SARS Coronavirus 2 by RT PCR: NEGATIVE

## 2020-01-16 MED ORDER — PROSOURCE PLUS PO LIQD: 30.0000 mL | Freq: Two times a day (BID) | ORAL | Status: DC

## 2020-01-16 MED ORDER — FOLIC ACID 1 MG PO TABS: 1.0000 mg | ORAL_TABLET | Freq: Every day | ORAL | Status: AC

## 2020-01-16 MED ORDER — ALUM & MAG HYDROXIDE-SIMETH 200-200-20 MG/5ML PO SUSP: 30.0000 mL | ORAL | 0 refills | Status: AC | PRN

## 2020-01-16 MED ORDER — ENSURE ENLIVE PO LIQD: 237.0000 mL | Freq: Two times a day (BID) | ORAL | 12 refills | Status: AC

## 2020-01-16 MED ORDER — DOCUSATE SODIUM 100 MG PO CAPS: 100.0000 mg | ORAL_CAPSULE | Freq: Two times a day (BID) | ORAL | 0 refills | Status: AC

## 2020-01-16 MED ORDER — ADULT MULTIVITAMIN W/MINERALS CH: 1.0000 | ORAL_TABLET | Freq: Every day | ORAL | Status: AC

## 2020-01-16 NOTE — Progress Notes (Signed)
On rounding noted gown taken off covers naked. Pulled every thing off arm bands and IV site. Complete bath and bed  Changed. Disoriented. Reoriented. Will cont to assess Neuro status.

## 2020-01-16 NOTE — Discharge Summary (Signed)
Physician Discharge Summary  Brighton Pilley ZOX:[id number] DOB: 1924/06/14 DOA: 01/10/2020  PCP: Malka So., MD  Admit date: 01/10/2020 Discharge date: 01/16/2020  Admitted From: Home Disposition: SNF  Recommendations for Outpatient Follow-up:  1. Follow up with PCP in 1-2 weeks 2. Please obtain BMP/CBC in one week 3. Please follow up on the following pending results: None  Home Health: No Equipment/Devices: Rolling walker Discharge Condition: Stable CODE STATUS: Full Diet recommendation: Heart Healthy   Brief/Interim Summary: Pleasant 86 year old male with history of HTN, CKD 3B, PVD, CAD with history of CABG came to the hospital after a fall complaining of right hip pain. He was found to have fracture on the x-ray, No syncope, no chest pain, this is a mechanical fall, he was trying to sit in the chair when his cat jumped off of it and surprised him and lost balance. Orthopedic was consulted and he was taken to the OR for ORIF on 01/12/2020. Tolerated the procedure well.  PT recommending SNF placement, and he is being discharged to rehab for further management. Discharged on aspirin per orthopedic.  Patient has an history of anemia of chronic disease with some iron deficiency and mildly elevated MCV.  B12 was within normal limit and folic acid was within lower normal limit.  He was started on iron and folic acid supplement.  Hemoglobin stable before discharge. Developed some thrombocytopenia which improved before discharge.  Patient has an history of CKD stage IIIb.  Follow-up with [facility] nephrology.  Baseline creatinine around 1.6.  Creatinine was at his baseline on discharge.  He has an history of coronary artery disease s/p CABG.  No acute concern during current hospitalization and he will continue his home meds.  Patient is high risk for mortality because of his age, comorbidities and recent hip fracture.  He will follow-up with his providers.   Discharge  Diagnoses:  Active Problems:   Hip fracture (HCC)   Malnutrition of moderate degree   Fall   Surgery, elective   Discharge Instructions  Discharge Instructions    Diet - low sodium heart healthy   Complete by: As directed    Increase activity slowly   Complete by: As directed    Leave dressing on - Keep it clean, dry, and intact until clinic visit   Complete by: As directed      Allergies as of 01/16/2020   No Known Allergies     Medication List    STOP taking these medications   aspirin 81 MG EC tablet Replaced by: aspirin 81 MG chewable tablet   loperamide 2 MG capsule Commonly known as: IMODIUM     TAKE these medications   (feeding supplement) PROSource Plus liquid Take 30 mLs by mouth 2 (two) times daily between meals.   feeding supplement Liqd Take 237 mLs by mouth 2 (two) times daily between meals.   alum & mag hydroxide-simeth 200-200-20 MG/5ML suspension Commonly known as: MAALOX/MYLANTA Take 30 mLs by mouth every 4 (four) hours as needed for indigestion.   amLODipine 5 MG tablet Commonly known as: NORVASC Take 5 mg by mouth daily.   aspirin 81 MG chewable tablet Commonly known as: Aspirin Childrens Chew 1 tablet (81 mg total) by mouth in the morning and at bedtime for 28 days. Then resume normal dose of aspirin 81 mg daily. Replaces: aspirin 81 MG EC tablet   carvedilol 6.25 MG tablet Commonly known as: COREG Take 6.25 mg by mouth 2 (two) times daily with a meal.  cyanocobalamin 1000 MCG/ML injection Commonly known as: (VITAMIN B-12) Inject 1,000 mg into the skin every 30 (thirty) days.   docusate sodium 100 MG capsule Commonly known as: COLACE Take 1 capsule (100 mg total) by mouth 2 (two) times daily.   ferrous sulfate 325 (65 FE) MG tablet Take 1 tablet (325 mg total) by mouth 3 (three) times daily after meals for 14 days.   folic acid 1 MG tablet Commonly known as: FOLVITE Take 1 tablet (1 mg total) by mouth daily.    HYDROcodone-acetaminophen 5-325 MG tablet Commonly known as: NORCO/VICODIN Take 1 tablet by mouth every 6 (six) hours as needed.   multivitamin with minerals Tabs tablet Take 1 tablet by mouth daily.   olmesartan 20 MG tablet Commonly known as: BENICAR Take 20 mg by mouth daily.   pravastatin 80 MG tablet Commonly known as: PRAVACHOL Take 80 mg by mouth every evening.   Travoprost (BAK Free) 0.004 % Soln ophthalmic solution Commonly known as: TRAVATAN Place 1 drop into both eyes at bedtime.            Discharge Care Instructions  (From admission, onward)         Start     Ordered   01/16/20 0000  Leave dressing on - Keep it clean, dry, and intact until clinic visit        01/16/20 1106          Follow-up Information    Schedule an appointment as soon as possible for a visit in 2 weeks to follow up.        Malka So., MD. Schedule an appointment as soon as possible for a visit.   Specialty: Internal Medicine       Durene Romans, MD. Schedule an appointment as soon as possible for a visit in 2 week(s).   Specialty: Orthopedic Surgery Contact information: [address] [place] [address] [place] [address] [phone]              No Known Allergies  Consultations:  Orthopedic  Procedures/Studies: DG Chest 1 View  Result Date: 01/10/2020 CLINICAL DATA:  Right hip fracture EXAM: CHEST  1 VIEW COMPARISON:  None. FINDINGS: Cardiomegaly. Prior CABG. Lungs are clear. No effusions. No acute bony abnormality. IMPRESSION: Cardiomegaly.  No acute cardiopulmonary disease. Electronically Signed   By: Charlett Nose M.D.   On: 01/10/2020 21:47   CT Head Wo Contrast  Result Date: 01/10/2020 CLINICAL DATA:  Head trauma, fall sliding out of chair at home. EXAM: CT HEAD WITHOUT CONTRAST TECHNIQUE: Contiguous axial images were obtained from the base of the skull through the vertex without intravenous contrast. COMPARISON:  None. FINDINGS: Brain: Age related  atrophy. Moderate periventricular and deep white matter hypodensity consistent with chronic small vessel ischemia. No intracranial hemorrhage, mass effect, or midline shift. No hydrocephalus. The basilar cisterns are patent. No evidence of territorial infarct or acute ischemia. No extra-axial or intracranial fluid collection. Vascular: Atherosclerosis of skullbase vasculature without hyperdense vessel or abnormal calcification. Skull: No fracture or focal lesion. Sinuses/Orbits: No acute findings. Rounded soft tissue density in posterior right ethmoid air cells and left maxillary sinus may represent small polyps. Mastoid air cells are clear. Bilateral cataract resection. Other: None. IMPRESSION: 1. No acute intracranial abnormality. No skull fracture. 2. Age related atrophy and chronic small vessel ischemia. Electronically Signed   By: Narda Rutherford M.D.   On: 01/10/2020 21:41   CT Cervical Spine Wo Contrast  Result Date: 01/10/2020 CLINICAL DATA:  Cervical spine injury suspected, fall sliding out of chair at home. EXAM: CT CERVICAL SPINE WITHOUT CONTRAST TECHNIQUE: Multidetector CT imaging of the cervical spine was performed without intravenous contrast. Multiplanar CT image reconstructions were also generated. COMPARISON:  None. FINDINGS: The anterior most aspect of C7 vertebral body is excluded from the field of view and sagittal and coronal reformats, no abnormalities seen on AP imaging. Alignment: Exaggerated cervical lordosis. No listhesis or traumatic subluxation. Skull base and vertebrae: No acute fracture. Vertebral body heights are maintained. The dens and skull base are intact. Degenerative pannus at C1-C2. Soft tissues and spinal canal: No prevertebral fluid or swelling. No visible canal hematoma. Disc levels: Mild disc space narrowing with endplate spurring at C5-C6. Additional endplate spurring with preservation of disc spaces. Multilevel facet hypertrophy. No bony canal stenosis. Upper chest:  Negative. Other: Carotid calcifications. IMPRESSION: Mild degenerative change in the cervical spine without acute fracture or subluxation. Electronically Signed   By: Narda Rutherford M.D.   On: 01/10/2020 21:46   DG C-Arm 1-60 Min-No Report  Result Date: 01/12/2020 CLINICAL DATA:  Right hip fracture. EXAM: OPERATIVE right HIP (WITH PELVIS IF PERFORMED) 7 VIEWS TECHNIQUE: Fluoroscopic spot image(s) were submitted for interpretation post-operatively. Radiation exposure index: 3.7353 mGy. COMPARISON:  January 10, 2020. FINDINGS: Seven intraoperative fluoroscopic images of the right hip demonstrate surgical internal fixation of intertrochanteric fracture involving the proximal right femur. Good alignment of fracture components is noted. IMPRESSION: Status post surgical internal fixation of proximal right femoral intertrochanteric fracture. Electronically Signed   By: Lupita Raider M.D.   On: 01/12/2020 19:13   DG HIP OPERATIVE UNILAT W OR W/O PELVIS RIGHT  Result Date: 01/12/2020 CLINICAL DATA:  Right hip fracture. EXAM: OPERATIVE right HIP (WITH PELVIS IF PERFORMED) 7 VIEWS TECHNIQUE: Fluoroscopic spot image(s) were submitted for interpretation post-operatively. Radiation exposure index: 3.7353 mGy. COMPARISON:  January 10, 2020. FINDINGS: Seven intraoperative fluoroscopic images of the right hip demonstrate surgical internal fixation of intertrochanteric fracture involving the proximal right femur. Good alignment of fracture components is noted. IMPRESSION: Status post surgical internal fixation of proximal right femoral intertrochanteric fracture. Electronically Signed   By: Lupita Raider M.D.   On: 01/12/2020 19:13   DG Hip Unilat W or Wo Pelvis 2-3 Views Right  Result Date: 01/10/2020 CLINICAL DATA:  Right hip pain.  Fall. EXAM: DG HIP (WITH OR WITHOUT PELVIS) 2-3V RIGHT COMPARISON:  None. FINDINGS: There is a right femoral intertrochanteric fracture. No significant angulation. No subluxation  or dislocation. Degenerative changes in the hips bilaterally. IMPRESSION: Right femoral intertrochanteric fracture. Electronically Signed   By: Charlett Nose M.D.   On: 01/10/2020 20:24    Subjective: Patient was seen and examined before discharge today.  No new complaint.  Stating I guess I am okay and I am ready to go to rehab.  Discharge Exam: Vitals:   01/16/20 0540 01/16/20 0933  BP:  (!) 152/66  Pulse: 72 75  Resp: 18 16  Temp: (!) 97.5 F (36.4 C) 98.5 F (36.9 C)  SpO2: 98% 97%   Vitals:   01/15/20 2038 01/15/20 2107 01/16/20 0540 01/16/20 0933  BP: (!) 138/92 (!) 124/54  (!) 152/66  Pulse: 96 76 72 75  Resp:  18 18 16   Temp: 98.8 F (37.1 C) 99.6 F (37.6 C) (!) 97.5 F (36.4 C) 98.5 F (36.9 C)  TempSrc: Oral Oral Oral Oral  SpO2: (!) 84% 96% 98% 97%  Weight:      Height:  General: Pt is alert, awake, not in acute distress Cardiovascular: RRR, S1/S2 +, no rubs, no gallops Respiratory: CTA bilaterally, no wheezing, no rhonchi Abdominal: Soft, NT, ND, bowel sounds + Extremities: no edema, no cyanosis   The results of significant diagnostics from this hospitalization (including imaging, microbiology, ancillary and laboratory) are listed below for reference.    Microbiology: Recent Results (from the past 240 hour(s))  Resp Panel by RT-PCR (Flu A&B, Covid) Nasopharyngeal Swab     Status: None   Collection Time: 01/10/20  9:20 PM   Specimen: Nasopharyngeal Swab; Nasopharyngeal(NP) swabs in vial transport medium  Result Value Ref Range Status   SARS Coronavirus 2 by RT PCR NEGATIVE NEGATIVE Final    Comment: (NOTE) SARS-CoV-2 target nucleic acids are NOT DETECTED.  The SARS-CoV-2 RNA is generally detectable in upper respiratory specimens during the acute phase of infection. The lowest concentration of SARS-CoV-2 viral copies this assay can detect is 138 copies/mL. A negative result does not preclude SARS-Cov-2 infection and should not be used as the  sole basis for treatment or other patient management decisions. A negative result may occur with  improper specimen collection/handling, submission of specimen other than nasopharyngeal swab, presence of viral mutation(s) within the areas targeted by this assay, and inadequate number of viral copies(<138 copies/mL). A negative result must be combined with clinical observations, patient history, and epidemiological information. The expected result is Negative.  Fact Sheet for Patients:  BloggerCourse.com  Fact Sheet for Healthcare Providers:  SeriousBroker.it  This test is no t yet approved or cleared by the [place] FDA and  has been authorized for detection and/or diagnosis of SARS-CoV-2 by FDA under an Emergency Use Authorization (EUA). This EUA will remain  in effect (meaning this test can be used) for the duration of the COVID-19 declaration under Section 564(b)(1) of the Act, 21 U.S.C.section 360bbb-3(b)(1), unless the authorization is terminated  or revoked sooner.       Influenza A by PCR NEGATIVE NEGATIVE Final   Influenza B by PCR NEGATIVE NEGATIVE Final    Comment: (NOTE) The Xpert Xpress SARS-CoV-2/FLU/RSV plus assay is intended as an aid in the diagnosis of influenza from Nasopharyngeal swab specimens and should not be used as a sole basis for treatment. Nasal washings and aspirates are unacceptable for Xpert Xpress SARS-CoV-2/FLU/RSV testing.  Fact Sheet for Patients: BloggerCourse.com  Fact Sheet for Healthcare Providers: SeriousBroker.it  This test is not yet approved or cleared by the [place] FDA and has been authorized for detection and/or diagnosis of SARS-CoV-2 by FDA under an Emergency Use Authorization (EUA). This EUA will remain in effect (meaning this test can be used) for the duration of the COVID-19 declaration under Section 564(b)(1) of the  Act, 21 U.S.C. section 360bbb-3(b)(1), unless the authorization is terminated or revoked.  Performed at [facility], [address], [place], [place] [address]   Surgical PCR screen     Status: None   Collection Time: 01/11/20 11:40 PM   Specimen: Nasal Mucosa; Nasal Swab  Result Value Ref Range Status   MRSA, PCR NEGATIVE NEGATIVE Final   Staphylococcus aureus NEGATIVE NEGATIVE Final    Comment: (NOTE) The Xpert SA Assay (FDA approved for NASAL specimens in patients 64 years of age and older), is one component of a comprehensive surveillance program. It is not intended to diagnose infection nor to guide or monitor treatment. Performed at [facility], [address] [place], [place] [address]   Resp Panel  by RT-PCR (Flu A&B, Covid) Nasopharyngeal Swab     Status: None   Collection Time: 01/16/20  8:54 AM   Specimen: Nasopharyngeal Swab; Nasopharyngeal(NP) swabs in vial transport medium  Result Value Ref Range Status   SARS Coronavirus 2 by RT PCR NEGATIVE NEGATIVE Final    Comment: (NOTE) SARS-CoV-2 target nucleic acids are NOT DETECTED.  The SARS-CoV-2 RNA is generally detectable in upper respiratory specimens during the acute phase of infection. The lowest concentration of SARS-CoV-2 viral copies this assay can detect is 138 copies/mL. A negative result does not preclude SARS-Cov-2 infection and should not be used as the sole basis for treatment or other patient management decisions. A negative result may occur with  improper specimen collection/handling, submission of specimen other than nasopharyngeal swab, presence of viral mutation(s) within the areas targeted by this assay, and inadequate number of viral copies(<138 copies/mL). A negative result must be combined with clinical observations, patient history, and epidemiological information. The expected result is Negative.  Fact Sheet for Patients:   BloggerCourse.com  Fact Sheet for Healthcare Providers:  SeriousBroker.it  This test is no t yet approved or cleared by the [place] FDA and  has been authorized for detection and/or diagnosis of SARS-CoV-2 by FDA under an Emergency Use Authorization (EUA). This EUA will remain  in effect (meaning this test can be used) for the duration of the COVID-19 declaration under Section 564(b)(1) of the Act, 21 U.S.C.section 360bbb-3(b)(1), unless the authorization is terminated  or revoked sooner.       Influenza A by PCR NEGATIVE NEGATIVE Final   Influenza B by PCR NEGATIVE NEGATIVE Final    Comment: (NOTE) The Xpert Xpress SARS-CoV-2/FLU/RSV plus assay is intended as an aid in the diagnosis of influenza from Nasopharyngeal swab specimens and should not be used as a sole basis for treatment. Nasal washings and aspirates are unacceptable for Xpert Xpress SARS-CoV-2/FLU/RSV testing.  Fact Sheet for Patients: BloggerCourse.com  Fact Sheet for Healthcare Providers: SeriousBroker.it  This test is not yet approved or cleared by the [place] FDA and has been authorized for detection and/or diagnosis of SARS-CoV-2 by FDA under an Emergency Use Authorization (EUA). This EUA will remain in effect (meaning this test can be used) for the duration of the COVID-19 declaration under Section 564(b)(1) of the Act, 21 U.S.C. section 360bbb-3(b)(1), unless the authorization is terminated or revoked.  Performed at [facility], [address], [place], [place] [address]      Labs: BNP (last 3 results) No results for input(s): BNP in the last 8760 hours. Basic Metabolic Panel: Recent Labs  Lab 01/10/20 2114 01/11/20 1128 01/12/20 0414 01/13/20 0436 01/14/20 0504 01/15/20 0453  NA 140  --  140 141 141 140  K 5.1  --  4.3 4.7 4.8 4.5  CL 105  --  108 110 110 111   CO2 24  --  20* 21* 26 24  GLUCOSE 118*  --  152* 165* 133* 140*  BUN 42*  --  33* 29* 43* 53*  CREATININE 1.82* 1.67* 1.63* 1.48* 1.73* 1.61*  CALCIUM 8.9  --  7.9* 7.8* 7.8* 7.8*   Liver Function Tests: Recent Labs  Lab 01/10/20 2114 01/13/20 0436  AST 22 20  ALT 17 13  ALKPHOS 55 42  BILITOT 0.3 0.7  PROT 6.6 5.4*  ALBUMIN 3.9 2.9*   No results for input(s): LIPASE, AMYLASE in the last 168 hours. No results for input(s): AMMONIA in the last 168 hours.  CBC: Recent Labs  Lab 01/10/20 2114 01/11/20 1128 01/12/20 0414 01/13/20 0436 01/14/20 0504 01/15/20 0453  WBC 11.7* 10.2 10.1 12.4* 9.5 10.2  NEUTROABS 6.9  --   --   --   --   --   HGB 12.8* 10.6* 9.6* 8.5* 7.4* 7.7*  HCT 38.3* 32.4* 30.0* 26.8* 23.3* 24.4*  MCV 100.0 101.6* 103.4* 105.5* 105.4* 106.1*  PLT 159 132* 120* 114* 122* 148*   Cardiac Enzymes: No results for input(s): CKTOTAL, CKMB, CKMBINDEX, TROPONINI in the last 168 hours. BNP: Invalid input(s): POCBNP CBG: No results for input(s): GLUCAP in the last 168 hours. D-Dimer No results for input(s): DDIMER in the last 72 hours. Hgb A1c No results for input(s): HGBA1C in the last 72 hours. Lipid Profile No results for input(s): CHOL, HDL, LDLCALC, TRIG, CHOLHDL, LDLDIRECT in the last 72 hours. Thyroid function studies No results for input(s): TSH, T4TOTAL, T3FREE, THYROIDAB in the last 72 hours.  Invalid input(s): FREET3 Anemia work up No results for input(s): VITAMINB12, FOLATE, FERRITIN, TIBC, IRON, RETICCTPCT in the last 72 hours. Urinalysis No results found for: COLORURINE, APPEARANCEUR, LABSPEC, PHURINE, GLUCOSEU, HGBUR, BILIRUBINUR, KETONESUR, PROTEINUR, UROBILINOGEN, NITRITE, LEUKOCYTESUR Sepsis Labs Invalid input(s): PROCALCITONIN,  WBC,  LACTICIDVEN Microbiology Recent Results (from the past 240 hour(s))  Resp Panel by RT-PCR (Flu A&B, Covid) Nasopharyngeal Swab     Status: None   Collection Time: 01/10/20  9:20 PM   Specimen:  Nasopharyngeal Swab; Nasopharyngeal(NP) swabs in vial transport medium  Result Value Ref Range Status   SARS Coronavirus 2 by RT PCR NEGATIVE NEGATIVE Final    Comment: (NOTE) SARS-CoV-2 target nucleic acids are NOT DETECTED.  The SARS-CoV-2 RNA is generally detectable in upper respiratory specimens during the acute phase of infection. The lowest concentration of SARS-CoV-2 viral copies this assay can detect is 138 copies/mL. A negative result does not preclude SARS-Cov-2 infection and should not be used as the sole basis for treatment or other patient management decisions. A negative result may occur with  improper specimen collection/handling, submission of specimen other than nasopharyngeal swab, presence of viral mutation(s) within the areas targeted by this assay, and inadequate number of viral copies(<138 copies/mL). A negative result must be combined with clinical observations, patient history, and epidemiological information. The expected result is Negative.  Fact Sheet for Patients:  BloggerCourse.com  Fact Sheet for Healthcare Providers:  SeriousBroker.it  This test is no t yet approved or cleared by the [place] FDA and  has been authorized for detection and/or diagnosis of SARS-CoV-2 by FDA under an Emergency Use Authorization (EUA). This EUA will remain  in effect (meaning this test can be used) for the duration of the COVID-19 declaration under Section 564(b)(1) of the Act, 21 U.S.C.section 360bbb-3(b)(1), unless the authorization is terminated  or revoked sooner.       Influenza A by PCR NEGATIVE NEGATIVE Final   Influenza B by PCR NEGATIVE NEGATIVE Final    Comment: (NOTE) The Xpert Xpress SARS-CoV-2/FLU/RSV plus assay is intended as an aid in the diagnosis of influenza from Nasopharyngeal swab specimens and should not be used as a sole basis for treatment. Nasal washings and aspirates are unacceptable for  Xpert Xpress SARS-CoV-2/FLU/RSV testing.  Fact Sheet for Patients: BloggerCourse.com  Fact Sheet for Healthcare Providers: SeriousBroker.it  This test is not yet approved or cleared by the [place] FDA and has been authorized for detection and/or diagnosis of SARS-CoV-2 by FDA under an Emergency Use Authorization (EUA). This EUA will remain in  effect (meaning this test can be used) for the duration of the COVID-19 declaration under Section 564(b)(1) of the Act, 21 U.S.C. section 360bbb-3(b)(1), unless the authorization is terminated or revoked.  Performed at [facility], [address] [place], [place] [address]   Surgical PCR screen     Status: None   Collection Time: 01/11/20 11:40 PM   Specimen: Nasal Mucosa; Nasal Swab  Result Value Ref Range Status   MRSA, PCR NEGATIVE NEGATIVE Final   Staphylococcus aureus NEGATIVE NEGATIVE Final    Comment: (NOTE) The Xpert SA Assay (FDA approved for NASAL specimens in patients 84 years of age and older), is one component of a comprehensive surveillance program. It is not intended to diagnose infection nor to guide or monitor treatment. Performed at [facility], [address] [place], [place] [id number]   Resp Panel by RT-PCR (Flu A&B, Covid) Nasopharyngeal Swab     Status: None   Collection Time: 01/16/20  8:54 AM   Specimen: Nasopharyngeal Swab; Nasopharyngeal(NP) swabs in vial transport medium  Result Value Ref Range Status   SARS Coronavirus 2 by RT PCR NEGATIVE NEGATIVE Final    Comment: (NOTE) SARS-CoV-2 target nucleic acids are NOT DETECTED.  The SARS-CoV-2 RNA is generally detectable in upper respiratory specimens during the acute phase of infection. The lowest concentration of SARS-CoV-2 viral copies this assay can detect is 138 copies/mL. A negative result does not preclude SARS-Cov-2 infection and should not be used as the sole basis  for treatment or other patient management decisions. A negative result may occur with  improper specimen collection/handling, submission of specimen other than nasopharyngeal swab, presence of viral mutation(s) within the areas targeted by this assay, and inadequate number of viral copies(<138 copies/mL). A negative result must be combined with clinical observations, patient history, and epidemiological information. The expected result is Negative.  Fact Sheet for Patients:  BloggerCourse.comhttps://www.fda.gov/media/152166/download  Fact Sheet for Healthcare Providers:  SeriousBroker.ithttps://www.fda.gov/media/152162/download  This test is no t yet approved or cleared by the [place] FDA and  has been authorized for detection and/or diagnosis of SARS-CoV-2 by FDA under an Emergency Use Authorization (EUA). This EUA will remain  in effect (meaning this test can be used) for the duration of the COVID-19 declaration under Section 564(b)(1) of the Act, 21 U.S.C.section 360bbb-3(b)(1), unless the authorization is terminated  or revoked sooner.       Influenza A by PCR NEGATIVE NEGATIVE Final   Influenza B by PCR NEGATIVE NEGATIVE Final    Comment: (NOTE) The Xpert Xpress SARS-CoV-2/FLU/RSV plus assay is intended as an aid in the diagnosis of influenza from Nasopharyngeal swab specimens and should not be used as a sole basis for treatment. Nasal washings and aspirates are unacceptable for Xpert Xpress SARS-CoV-2/FLU/RSV testing.  Fact Sheet for Patients: BloggerCourse.comhttps://www.fda.gov/media/152166/download  Fact Sheet for Healthcare Providers: SeriousBroker.ithttps://www.fda.gov/media/152162/download  This test is not yet approved or cleared by the [place] FDA and has been authorized for detection and/or diagnosis of SARS-CoV-2 by FDA under an Emergency Use Authorization (EUA). This EUA will remain in effect (meaning this test can be used) for the duration of the COVID-19 declaration under Section 564(b)(1) of the Act, 21  U.S.C. section 360bbb-3(b)(1), unless the authorization is terminated or revoked.  Performed at [facility], [address] [place], [place] [address]     Time coordinating discharge: Over 30 minutes  SIGNED:  Arnetha CourserSumayya Kristena Wilhelmi, MD  [facility] 01/16/2020, 11:09 AM  If 7PM-7AM, please  contact night-coverage [email or web address]  This record has been created using Systems analyst. Errors have been sought and corrected,but may not always be located. Such creation errors do not reflect on the standard of care.

## 2020-01-16 NOTE — Plan of Care (Signed)
Patient discharged to facility Problem: Education: Goal: Knowledge of General Education information will improve Description: Including pain rating scale, medication(s)/side effects and non-pharmacologic comfort measures Outcome: Adequate for Discharge   Problem: Health Behavior/Discharge Planning: Goal: Ability to manage health-related needs will improve Outcome: Adequate for Discharge   Problem: Clinical Measurements: Goal: Ability to maintain clinical measurements within normal limits will improve Outcome: Adequate for Discharge Goal: Will remain free from infection Outcome: Adequate for Discharge Goal: Diagnostic test results will improve Outcome: Adequate for Discharge Goal: Cardiovascular complication will be avoided Outcome: Adequate for Discharge   Problem: Activity: Goal: Risk for activity intolerance will decrease Outcome: Adequate for Discharge   Problem: Nutrition: Goal: Adequate nutrition will be maintained Outcome: Adequate for Discharge   Problem: Coping: Goal: Level of anxiety will decrease Outcome: Adequate for Discharge   Problem: Elimination: Goal: Will not experience complications related to bowel motility Outcome: Adequate for Discharge Goal: Will not experience complications related to urinary retention Outcome: Adequate for Discharge   Problem: Pain Managment: Goal: General experience of comfort will improve Outcome: Adequate for Discharge   Problem: Safety: Goal: Ability to remain free from injury will improve Outcome: Adequate for Discharge   Problem: Skin Integrity: Goal: Risk for impaired skin integrity will decrease Outcome: Adequate for Discharge   Problem: Education: Goal: Verbalization of understanding the information provided (i.e., activity precautions, restrictions, etc) will improve Outcome: Adequate for Discharge Goal: Individualized Educational Video(s) Outcome: Adequate for Discharge   Problem: Activity: Goal: Ability to  ambulate and perform ADLs will improve Outcome: Adequate for Discharge   Problem: Clinical Measurements: Goal: Postoperative complications will be avoided or minimized Outcome: Adequate for Discharge   Problem: Self-Concept: Goal: Ability to maintain and perform role responsibilities to the fullest extent possible will improve Outcome: Adequate for Discharge   Problem: Pain Management: Goal: Pain level will decrease Outcome: Adequate for Discharge

## 2020-01-16 NOTE — TOC Progression Note (Signed)
Transition of Care [facility]) - Progression Note    Patient Details  Name: Mickael Mcnutt MRN: [id number] Date of Birth: 08-11-1924  Transition of Care [facility]) CM/SW Contact  Geni Bers, RN Phone Number: 01/16/2020, 12:19 PM  Clinical Narrative:    Transportation [facility] was called for pt. RN is aware. Family on their way here to hospital.    Expected Discharge Plan: Skilled Nursing Facility Barriers to Discharge: No Barriers Identified  Expected Discharge Plan and Services Expected Discharge Plan: Skilled Nursing Facility       Living arrangements for the past 2 months: Single Family Home Expected Discharge Date: 01/16/20                                     Social Determinants of Health (SDOH) Interventions    Readmission Risk Interventions No flowsheet data found.

## 2020-01-16 NOTE — TOC Progression Note (Signed)
Transition of Care [facility]) - Progression Note    Patient Details  Name: Kion Huntsberry MRN: [id number] Date of Birth: [birth year]  Transition of Care [facility]) CM/SW Contact  Geni Bers, RN Phone Number: 01/16/2020, 11:18 AM  Clinical Narrative:    [facility] was called to inform of pt's admission to [facility]. Ihor Austin Okivergi-Matt [phone] ext [phone] to start VA benefits. Authorization from [facility] [id number], 12/17-12/21. [facility] offered pt a bed.    Expected Discharge Plan: Skilled Nursing Facility Barriers to Discharge: No Barriers Identified  Expected Discharge Plan and Services Expected Discharge Plan: Skilled Nursing Facility       Living arrangements for the past 2 months: Single Family Home Expected Discharge Date: 01/16/20                                     Social Determinants of Health (SDOH) Interventions    Readmission Risk Interventions No flowsheet data found.

## 2020-01-19 ENCOUNTER — Non-Acute Institutional Stay (SKILLED_NURSING_FACILITY): Payer: Medicare Other | Admitting: Orthopedic Surgery

## 2020-01-19 ENCOUNTER — Encounter: Payer: Self-pay | Admitting: Orthopedic Surgery

## 2020-01-19 DIAGNOSIS — Z8781 Personal history of (healed) traumatic fracture: Secondary | ICD-10-CM | POA: Diagnosis not present

## 2020-01-19 DIAGNOSIS — R1310 Dysphagia, unspecified: Secondary | ICD-10-CM | POA: Diagnosis not present

## 2020-01-19 NOTE — Progress Notes (Signed)
Location:    [facility] Room Number: 511/P Place of Service:  SNF (31) Provider:Joshiah Traynham NP   Malka So., MD  Patient Care Team: Malka So., MD as PCP - General (Internal Medicine) Beverely Low, MD as Consulting Physician (Orthopedic Surgery) Durene Romans, MD as Consulting Physician (Orthopedic Surgery)  Extended Emergency Contact Information Primary Emergency Contact: Gueye,Mary Home Phone: [phone] Mobile Phone: [phone] Relation: Spouse Secondary Emergency Contact: Aggie Cosier Mobile Phone: [phone] Relation: Daughter  Code Status: DNR  Goals of care: Advanced Directive information Advanced Directives 01/19/2020  Does Patient Have a Medical Advance Directive? Yes  Type of Advance Directive Out of facility DNR (pink MOST or yellow form)  Does patient want to make changes to medical advance directive? No - Patient declined  Pre-existing out of facility DNR order (yellow form or pink MOST form) Yellow form placed in chart (order not valid for inpatient use)     Chief Complaint  Patient presents with  . Acute Visit    Requesting tylenol having trouble swallowing     HPI:  Pt is a 86 y.o. male seen today for an acute visit for difficulty swallowing.   He is a resident of [facility], seen at bedside today. PMH includes: hypertension, chronic kidney disease stage III, peripheral vascular disease, and coronary artery disease with CABG. Prior to stay, he lived at home. On 12/11 his cat startled him and he slipped out of his chair on to the floor. His right hip began to hurt after incident. He was hospitalized at [facility] 12/11-12/17 for right hip fracture. Right intramedullary nail performed by Dr. Charlann Boxer 12/13. He tolerated procedure well, EBL< 150cc. Advised to follow up with PCP for lab work and started on aspirin for dvt propylaxis. Due ot progressive weakness he was discharged to  [facility] for additional PT/OT and skilled nursing services.   Today, facility night nurse reports he is having difficulty swallowing medications. Patient states it hurts to swallow. Denies heartburn, sore throat or feeling of food hanging. He points to the middle of his chest and states "this is where the pain is." I had him take a sip of water and he had no difficulty, no cough. He was able to eat breakfast this morning with no complications. Seen by facility speech therapist 12/18, no precautions noted. He remains on a regular diet with thin liquids. Day shift facility nurse does not report trouble swallowing medications this AM.   In addition, patient states the norco is too strong for pain, sleeping more than he would like. He is requesting tylenol for right leg pain.    Facility nurse does not report any other concerns, vitals stable.       Past Medical History:  Diagnosis Date  . Coronary artery disease   . Hypertension    Past Surgical History:  Procedure Laterality Date  . ABDOMINAL AORTIC ANEURYSM REPAIR    . COLECTOMY    . CORONARY ARTERY BYPASS GRAFT    . FEMUR IM NAIL Right 01/12/2020   Procedure: INTRAMEDULLARY (IM) NAIL FEMORAL;  Surgeon: Durene Romans, MD;  Location: [facility] ORS;  Service: Orthopedics;  Laterality: Right;  . HERNIA REPAIR      No Known Allergies  Allergies as of 01/19/2020   No Known Allergies     Medication List       Accurate as of January 19, 2020 11:42 AM. If you have any questions, ask your  nurse or doctor.        STOP taking these medications   cyanocobalamin 1000 MCG/ML injection Commonly known as: (VITAMIN B-12) Stopped by: Octavia Heir, NP     TAKE these medications   acetaminophen 500 MG tablet Commonly known as: TYLENOL Take 1,000 mg by mouth 2 (two) times daily.   alum & mag hydroxide-simeth 200-200-20 MG/5ML suspension Commonly known as: MAALOX/MYLANTA Take 30 mLs by mouth every 4 (four) hours as needed for indigestion.    amLODipine 5 MG tablet Commonly known as: NORVASC Take 5 mg by mouth daily.   aspirin 81 MG chewable tablet Commonly known as: Aspirin Childrens Chew 1 tablet (81 mg total) by mouth in the morning and at bedtime for 28 days. Then resume normal dose of aspirin 81 mg daily.   carvedilol 6.25 MG tablet Commonly known as: COREG Take 6.25 mg by mouth 2 (two) times daily with a meal.   docusate sodium 100 MG capsule Commonly known as: COLACE Take 1 capsule (100 mg total) by mouth 2 (two) times daily.   feeding supplement (PRO-STAT 64) Liqd Take 30 mLs by mouth 2 (two) times daily between meals.   feeding supplement Liqd Take 237 mLs by mouth 2 (two) times daily between meals. What changed: Another medication with the same name was removed. Continue taking this medication, and follow the directions you see here. Changed by: Octavia Heir, NP   ferrous sulfate 325 (65 FE) MG tablet Take 1 tablet (325 mg total) by mouth 3 (three) times daily after meals for 14 days.   folic acid 1 MG tablet Commonly known as: FOLVITE Take 1 tablet (1 mg total) by mouth daily.   HYDROcodone-acetaminophen 5-325 MG tablet Commonly known as: NORCO/VICODIN Take 1 tablet by mouth every 6 (six) hours as needed.   multivitamin with minerals Tabs tablet Take 1 tablet by mouth daily.   olmesartan 20 MG tablet Commonly known as: BENICAR Take 20 mg by mouth daily.   pravastatin 80 MG tablet Commonly known as: PRAVACHOL Take 80 mg by mouth every evening.   Travoprost (BAK Free) 0.004 % Soln ophthalmic solution Commonly known as: TRAVATAN Place 1 drop into both eyes at bedtime.       Review of Systems  Constitutional: Negative for activity change, appetite change, fever and unexpected weight change.  HENT: Positive for hearing loss and trouble swallowing. Negative for congestion, dental problem and sore throat.   Eyes: Negative for visual disturbance.  Respiratory: Negative for cough, chest  tightness, shortness of breath and wheezing.   Cardiovascular: Negative for chest pain and leg swelling.  Gastrointestinal: Negative for abdominal pain, constipation, diarrhea and nausea.  Musculoskeletal:       Right hip fracture  Skin:       Dry skin  Psychiatric/Behavioral: Negative for dysphoric mood and sleep disturbance. The patient is not nervous/anxious.     Immunization History  Administered Date(s) Administered  . Influenza Split 10/31/2003, 10/22/2007, 10/31/2009, 11/03/2013, 11/02/2014, 11/05/2017  . Influenza, High Dose Seasonal PF 10/26/2018, 10/26/2018  . Influenza, Seasonal, Injecte, Preservative Fre 10/31/2003, 10/22/2007, 10/31/2009, 11/03/2013, 11/02/2014  . Influenza-Unspecified 11/03/2013, 11/03/2013, 11/02/2014, 11/02/2014, 12/31/2019  . Moderna Sars-Covid-2 Vaccination 04/10/2019, 05/13/2019  . Pneumococcal Conjugate-13 12/01/2002, 09/16/2010  . Pneumococcal Polysaccharide-23 12/01/2002, 09/16/2010  . Pneumococcal-Unspecified 09/16/2010, 09/16/2010  . Td 12/20/2004, 12/20/2004  . Tdap 01/30/2009, 03/30/2009   Pertinent  Health Maintenance Due  Topic Date Due  . PNA vac Low Risk Adult (2 of 2 - PCV13) 09/16/2011  .  INFLUENZA VACCINE  Completed   No flowsheet data found. Functional Status Survey:    Vitals:   01/19/20 1140  BP: 118/70  Pulse: 77  Resp: 18  Temp: 98 F (36.7 C)  SpO2: 97%  Weight: 147 lb 14.9 oz (67.1 kg)  Height: 5\' 7"  (1.702 m)   Body mass index is 23.17 kg/m. Physical Exam Constitutional:      General: He is not in acute distress.    Appearance: Normal appearance.     Comments: pale  HENT:     Head: Normocephalic.     Mouth/Throat:     Mouth: Mucous membranes are moist.     Pharynx: No posterior oropharyngeal erythema.  Cardiovascular:     Rate and Rhythm: Normal rate and regular rhythm.     Pulses: Normal pulses.     Heart sounds: Normal heart sounds. No murmur heard.   Pulmonary:     Effort: Pulmonary effort is  normal. No respiratory distress.     Breath sounds: Normal breath sounds. No wheezing.  Abdominal:     General: Abdomen is flat. Bowel sounds are normal. There is no distension.     Palpations: Abdomen is soft.     Tenderness: There is no abdominal tenderness.  Musculoskeletal:     Right lower leg: No edema.     Left lower leg: No edema.     Comments: Right surgical dressing CDI, no drainage. Surrounding tissue intact.   Skin:    General: Skin is warm and dry.  Neurological:     Mental Status: He is alert.     Labs reviewed: Recent Labs    01/13/20 0436 01/14/20 0504 01/15/20 0453  NA 141 141 140  K 4.7 4.8 4.5  CL 110 110 111  CO2 21* 26 24  GLUCOSE 165* 133* 140*  BUN 29* 43* 53*  CREATININE 1.48* 1.73* 1.61*  CALCIUM 7.8* 7.8* 7.8*   Recent Labs    01/10/20 2114 01/13/20 0436  AST 22 20  ALT 17 13  ALKPHOS 55 42  BILITOT 0.3 0.7  PROT 6.6 5.4*  ALBUMIN 3.9 2.9*   Recent Labs    01/10/20 2114 01/11/20 1128 01/13/20 0436 01/14/20 0504 01/15/20 0453  WBC 11.7*   < > 12.4* 9.5 10.2  NEUTROABS 6.9  --   --   --   --   HGB 12.8*   < > 8.5* 7.4* 7.7*  HCT 38.3*   < > 26.8* 23.3* 24.4*  MCV 100.0   < > 105.5* 105.4* 106.1*  PLT 159   < > 114* 122* 148*   < > = values in this interval not displayed.   No results found for: TSH No results found for: HGBA1C No results found for: CHOL, HDL, LDLCALC, LDLDIRECT, TRIG, CHOLHDL  Significant Diagnostic Results in last 30 days:  DG Chest 1 View  Result Date: 01/10/2020 CLINICAL DATA:  Right hip fracture EXAM: CHEST  1 VIEW COMPARISON:  None. FINDINGS: Cardiomegaly. Prior CABG. Lungs are clear. No effusions. No acute bony abnormality. IMPRESSION: Cardiomegaly.  No acute cardiopulmonary disease. Electronically Signed   By: Charlett NoseKevin  Dover M.D.   On: 01/10/2020 21:47   CT Head Wo Contrast  Result Date: 01/10/2020 CLINICAL DATA:  Head trauma, fall sliding out of chair at home. EXAM: CT HEAD WITHOUT CONTRAST TECHNIQUE:  Contiguous axial images were obtained from the base of the skull through the vertex without intravenous contrast. COMPARISON:  None. FINDINGS: Brain: Age related atrophy. Moderate periventricular  and deep white matter hypodensity consistent with chronic small vessel ischemia. No intracranial hemorrhage, mass effect, or midline shift. No hydrocephalus. The basilar cisterns are patent. No evidence of territorial infarct or acute ischemia. No extra-axial or intracranial fluid collection. Vascular: Atherosclerosis of skullbase vasculature without hyperdense vessel or abnormal calcification. Skull: No fracture or focal lesion. Sinuses/Orbits: No acute findings. Rounded soft tissue density in posterior right ethmoid air cells and left maxillary sinus may represent small polyps. Mastoid air cells are clear. Bilateral cataract resection. Other: None. IMPRESSION: 1. No acute intracranial abnormality. No skull fracture. 2. Age related atrophy and chronic small vessel ischemia. Electronically Signed   By: Narda Rutherford M.D.   On: 01/10/2020 21:41   CT Cervical Spine Wo Contrast  Result Date: 01/10/2020 CLINICAL DATA:  Cervical spine injury suspected, fall sliding out of chair at home. EXAM: CT CERVICAL SPINE WITHOUT CONTRAST TECHNIQUE: Multidetector CT imaging of the cervical spine was performed without intravenous contrast. Multiplanar CT image reconstructions were also generated. COMPARISON:  None. FINDINGS: The anterior most aspect of C7 vertebral body is excluded from the field of view and sagittal and coronal reformats, no abnormalities seen on AP imaging. Alignment: Exaggerated cervical lordosis. No listhesis or traumatic subluxation. Skull base and vertebrae: No acute fracture. Vertebral body heights are maintained. The dens and skull base are intact. Degenerative pannus at C1-C2. Soft tissues and spinal canal: No prevertebral fluid or swelling. No visible canal hematoma. Disc levels: Mild disc space narrowing  with endplate spurring at C5-C6. Additional endplate spurring with preservation of disc spaces. Multilevel facet hypertrophy. No bony canal stenosis. Upper chest: Negative. Other: Carotid calcifications. IMPRESSION: Mild degenerative change in the cervical spine without acute fracture or subluxation. Electronically Signed   By: Narda Rutherford M.D.   On: 01/10/2020 21:46   DG C-Arm 1-60 Min-No Report  Result Date: 01/12/2020 CLINICAL DATA:  Right hip fracture. EXAM: OPERATIVE right HIP (WITH PELVIS IF PERFORMED) 7 VIEWS TECHNIQUE: Fluoroscopic spot image(s) were submitted for interpretation post-operatively. Radiation exposure index: 3.7353 mGy. COMPARISON:  January 10, 2020. FINDINGS: Seven intraoperative fluoroscopic images of the right hip demonstrate surgical internal fixation of intertrochanteric fracture involving the proximal right femur. Good alignment of fracture components is noted. IMPRESSION: Status post surgical internal fixation of proximal right femoral intertrochanteric fracture. Electronically Signed   By: Lupita Raider M.D.   On: 01/12/2020 19:13   DG HIP OPERATIVE UNILAT W OR W/O PELVIS RIGHT  Result Date: 01/12/2020 CLINICAL DATA:  Right hip fracture. EXAM: OPERATIVE right HIP (WITH PELVIS IF PERFORMED) 7 VIEWS TECHNIQUE: Fluoroscopic spot image(s) were submitted for interpretation post-operatively. Radiation exposure index: 3.7353 mGy. COMPARISON:  January 10, 2020. FINDINGS: Seven intraoperative fluoroscopic images of the right hip demonstrate surgical internal fixation of intertrochanteric fracture involving the proximal right femur. Good alignment of fracture components is noted. IMPRESSION: Status post surgical internal fixation of proximal right femoral intertrochanteric fracture. Electronically Signed   By: Lupita Raider M.D.   On: 01/12/2020 19:13   DG Hip Unilat W or Wo Pelvis 2-3 Views Right  Result Date: 01/10/2020 CLINICAL DATA:  Right hip pain.  Fall. EXAM: DG  HIP (WITH OR WITHOUT PELVIS) 2-3V RIGHT COMPARISON:  None. FINDINGS: There is a right femoral intertrochanteric fracture. No significant angulation. No subluxation or dislocation. Degenerative changes in the hips bilaterally. IMPRESSION: Right femoral intertrochanteric fracture. Electronically Signed   By: Charlett Nose M.D.   On: 01/10/2020 20:24    Assessment/Plan 1. Dysphagia, unspecified type -currently  on regular diet with thin liquids - no cough or trouble swallowing water during exam - will notify facility speech therapist to f/u with patient - recommend sitting upright when eating - recommend drinking plenty of water when eating - continue maalox prn for indigestion  2. S/P right hip fracture - stable, surgical dressing CDI, no drainage - right leg pain minimal - will discontinue norco - start tylenol 1000 mg PO BID for pain    Family/ staff Communication: Plan discussed with patient and facility nurse  Labs/tests ordered:  none

## 2020-01-21 ENCOUNTER — Encounter: Payer: Self-pay | Admitting: Orthopedic Surgery

## 2020-01-21 ENCOUNTER — Non-Acute Institutional Stay (SKILLED_NURSING_FACILITY): Payer: Medicare Other | Admitting: Orthopedic Surgery

## 2020-01-21 DIAGNOSIS — R1310 Dysphagia, unspecified: Secondary | ICD-10-CM

## 2020-01-21 DIAGNOSIS — I25810 Atherosclerosis of coronary artery bypass graft(s) without angina pectoris: Secondary | ICD-10-CM | POA: Insufficient documentation

## 2020-01-21 DIAGNOSIS — N1832 Chronic kidney disease, stage 3b: Secondary | ICD-10-CM

## 2020-01-21 DIAGNOSIS — I739 Peripheral vascular disease, unspecified: Secondary | ICD-10-CM | POA: Diagnosis not present

## 2020-01-21 DIAGNOSIS — D649 Anemia, unspecified: Secondary | ICD-10-CM | POA: Insufficient documentation

## 2020-01-21 DIAGNOSIS — D508 Other iron deficiency anemias: Secondary | ICD-10-CM

## 2020-01-21 DIAGNOSIS — I10 Essential (primary) hypertension: Secondary | ICD-10-CM | POA: Diagnosis not present

## 2020-01-21 DIAGNOSIS — Z8781 Personal history of (healed) traumatic fracture: Secondary | ICD-10-CM

## 2020-01-21 HISTORY — DX: Chronic kidney disease, stage 3b: N18.32

## 2020-01-21 NOTE — Progress Notes (Signed)
Careteam: Patient Care Team: Malka So., MD as PCP - General (Internal Medicine) Beverely Low, MD as Consulting Physician (Orthopedic Surgery) Durene Romans, MD as Consulting Physician (Orthopedic Surgery)  Seen by: Hazle Nordmann, AGNP-C  PLACE OF SERVICE: [facility]  Advanced Directive information    No Known Allergies  Chief Complaint  Patient presents with  . Acute Visit    Patient is seen for hospital followup     HPI: Patient is a 86 y.o. male seen today for hospital follow up.   He is currently residing on the skilled nursing hall of [facility], seen at bedside today. PMH includes: chronic kidney stage 3b, peripheral vascular disease, coronary artery disease with CABG, and hypertension.   Prior to stay he was hospitalized for right hip fracture 12/11-12/17. On 12/13, Dr. Charlann Boxer performed IM nail of right femur. He tolerated surgery well, but PT recommended SNF for additional PT/OT and skilled services. He has a long history of anemia, he was given iron and folic acid supplementation prior to discharge. Thrombocytopenia resolved before hospital discharge. He was advised to take asa 81 mg twice daily for 28 days, given hydrocodone for postoperative pain, and advised to follow up with Dr. Charlann Boxer in two weeks.   He was seen 12/20 for pain. He expressed that his pain was minor and wished to be taken off norco. Tylenol 1000 mg PO BID prn was ordered. He also expressed trouble swallowing during encounter. States he had sternal pain at times when he swallows. He was seen by ST 12/21 and further swallowing studies were recommended. Recommendations were discussed with daughter and declined at this time. Today, he states his food still feels like it is getting hung. He was told by ST to drink a lot of fluids when eating or taking medication. He thinks it is helping and does not feel like food is getting stuck as much.   Facility nurse  reports daughter would like a appetite stimulant for her father. I asked [place] about his appetite and he states he is eating 50 percent of his meals, also drinking ensure supplement shakes. He does not want to take another medication. Facility nurse confirms he is eating 50 percent of meals.   He continues to work with PT. Remains WBAT with front wheeled walker. Ambulates 20-30 feet. Minimal assistance with transfers. Plan to be discharged home after rehab stay.   Recorded blood pressures are as follows:  12/22- 105/47  12/21- 101/53  12/20- 118/70  No recent falls or injuries.   Facility nurse does not report any concerns, vitals stable.     Review of Systems:  Review of Systems  Constitutional: Negative for fever and malaise/fatigue.  HENT: Positive for hearing loss. Negative for sore throat.   Eyes: Negative for blurred vision.       Glasses  Respiratory: Negative for cough, shortness of breath and wheezing.   Cardiovascular: Negative for chest pain and leg swelling.  Gastrointestinal: Negative for abdominal pain, constipation, heartburn and nausea.  Genitourinary: Negative for dysuria and hematuria.  Musculoskeletal: Positive for falls, joint pain and myalgias.       Right leg pain  Neurological: Positive for weakness. Negative for dizziness and headaches.  Psychiatric/Behavioral: Negative for depression and memory loss. The patient is not nervous/anxious and does not have insomnia.     Past Medical History:  Diagnosis Date  . Coronary artery disease   . Hypertension    Past Surgical  History:  Procedure Laterality Date  . ABDOMINAL AORTIC ANEURYSM REPAIR    . COLECTOMY    . CORONARY ARTERY BYPASS GRAFT    . FEMUR IM NAIL Right 01/12/2020   Procedure: INTRAMEDULLARY (IM) NAIL FEMORAL;  Surgeon: Durene Romans, MD;  Location: [facility];  Service: Orthopedics;  Laterality: Right;  . HERNIA REPAIR     Social History:   reports that he has quit smoking. He has never used  smokeless tobacco. He reports current alcohol use. He reports that he does not use drugs.  No family history on file.  Medications: Patient's Medications  New Prescriptions   No medications on file  Previous Medications   ACETAMINOPHEN (TYLENOL) 500 MG TABLET    Take 1,000 mg by mouth 2 (two) times daily.   ALUM & MAG HYDROXIDE-SIMETH (MAALOX/MYLANTA) 200-200-20 MG/5ML SUSPENSION    Take 30 mLs by mouth every 4 (four) hours as needed for indigestion.   AMINO ACIDS-PROTEIN HYDROLYS (FEEDING SUPPLEMENT, PRO-STAT 64,) LIQD    Take 30 mLs by mouth 2 (two) times daily between meals.   AMLODIPINE (NORVASC) 5 MG TABLET    Take 5 mg by mouth daily.   ASPIRIN (ASPIRIN CHILDRENS) 81 MG CHEWABLE TABLET    Chew 1 tablet (81 mg total) by mouth in the morning and at bedtime for 28 days. Then resume normal dose of aspirin 81 mg daily.   CARVEDILOL (COREG) 6.25 MG TABLET    Take 6.25 mg by mouth 2 (two) times daily with a meal.   DOCUSATE SODIUM (COLACE) 100 MG CAPSULE    Take 1 capsule (100 mg total) by mouth 2 (two) times daily.   FEEDING SUPPLEMENT (ENSURE ENLIVE / ENSURE PLUS) LIQD    Take 237 mLs by mouth 2 (two) times daily between meals.   FERROUS SULFATE 325 (65 FE) MG TABLET    Take 1 tablet (325 mg total) by mouth 3 (three) times daily after meals for 14 days.   FOLIC ACID (FOLVITE) 1 MG TABLET    Take 1 tablet (1 mg total) by mouth daily.   HYDROCODONE-ACETAMINOPHEN (NORCO/VICODIN) 5-325 MG TABLET    Take 1 tablet by mouth every 6 (six) hours as needed.   MULTIPLE VITAMIN (MULTIVITAMIN WITH MINERALS) TABS TABLET    Take 1 tablet by mouth daily.   OLMESARTAN (BENICAR) 20 MG TABLET    Take 20 mg by mouth daily.   PRAVASTATIN (PRAVACHOL) 80 MG TABLET    Take 80 mg by mouth every evening.   TRAVOPROST, BAK FREE, (TRAVATAN) 0.004 % SOLN OPHTHALMIC SOLUTION    Place 1 drop into both eyes at bedtime.  Modified Medications   No medications on file  Discontinued Medications   No medications on file     Physical Exam:  Vitals:   01/21/20 1150  BP: (!) 105/47  Pulse: 75  Resp: 19  Temp: (!) 97.3 F (36.3 C)  TempSrc: Oral  SpO2: 97%  Weight: 158 lb 6.4 oz (71.8 kg)  Height: 5\' 10"  (1.778 m)   Body mass index is 22.73 kg/m. Wt Readings from Last 3 Encounters:  01/21/20 158 lb 6.4 oz (71.8 kg)  01/19/20 147 lb 14.9 oz (67.1 kg)  01/12/20 147 lb 14.9 oz (67.1 kg)    Physical Exam Vitals reviewed.  Constitutional:      General: He is not in acute distress.    Appearance: Normal appearance.  HENT:     Head: Normocephalic.     Right Ear: There is no impacted  cerumen.     Left Ear: There is no impacted cerumen.     Mouth/Throat:     Mouth: Mucous membranes are moist.     Pharynx: No posterior oropharyngeal erythema.  Eyes:     Pupils: Pupils are equal, round, and reactive to light.  Cardiovascular:     Rate and Rhythm: Normal rate and regular rhythm.     Pulses: Normal pulses.     Heart sounds: Normal heart sounds. No murmur heard.   Pulmonary:     Effort: Pulmonary effort is normal. No respiratory distress.     Breath sounds: Normal breath sounds. No wheezing.  Abdominal:     General: Abdomen is flat. Bowel sounds are normal. There is no distension.     Palpations: Abdomen is soft.     Tenderness: There is no abdominal tenderness.     Comments: Bowel sounds hyperactive x 4  Musculoskeletal:     Cervical back: Normal range of motion.     Right lower leg: No edema.     Left lower leg: No edema.     Comments: Right surgical incision CDI. No drainage or tenderness. Surrounding skin intact.   Lymphadenopathy:     Cervical: No cervical adenopathy.  Skin:    General: Skin is warm and dry.     Capillary Refill: Capillary refill takes less than 2 seconds.  Neurological:     General: No focal deficit present.     Mental Status: He is alert and oriented to person, place, and time.     Motor: Weakness present.     Gait: Gait abnormal.     Comments: Front wheeled  walker  Psychiatric:        Mood and Affect: Mood normal.        Behavior: Behavior normal.     Labs reviewed: Basic Metabolic Panel: Recent Labs    01/13/20 0436 01/14/20 0504 01/15/20 0453  NA 141 141 140  K 4.7 4.8 4.5  CL 110 110 111  CO2 21* 26 24  GLUCOSE 165* 133* 140*  BUN 29* 43* 53*  CREATININE 1.48* 1.73* 1.61*  CALCIUM 7.8* 7.8* 7.8*   Liver Function Tests: Recent Labs    01/10/20 2114 01/13/20 0436  AST 22 20  ALT 17 13  ALKPHOS 55 42  BILITOT 0.3 0.7  PROT 6.6 5.4*  ALBUMIN 3.9 2.9*   No results for input(s): LIPASE, AMYLASE in the last 8760 hours. No results for input(s): AMMONIA in the last 8760 hours. CBC: Recent Labs    01/10/20 2114 01/11/20 1128 01/13/20 0436 01/14/20 0504 01/15/20 0453  WBC 11.7*   < > 12.4* 9.5 10.2  NEUTROABS 6.9  --   --   --   --   HGB 12.8*   < > 8.5* 7.4* 7.7*  HCT 38.3*   < > 26.8* 23.3* 24.4*  MCV 100.0   < > 105.5* 105.4* 106.1*  PLT 159   < > 114* 122* 148*   < > = values in this interval not displayed.   Lipid Panel: No results for input(s): CHOL, HDL, LDLCALC, TRIG, CHOLHDL, LDLDIRECT in the last 8760 hours. TSH: No results for input(s): TSH in the last 8760 hours. A1C: No results found for: HGBA1C   Assessment/Plan 1. S/P right hip fracture - stable, surgical dressing CDI, no drainage, remains WBAT - pain minimal with tylenol - continue 2 week f/u with Dr. Charlann Boxer - continue asa 81 mg for 28 days - continue PT/OT  with front wheeled walker  2. Essential hypertension - stable with medication - continue metoprolol and amlodipine - cbc/diff scheduled 12/23  3. Chronic kidney disease, stage 3b (HCC) - stable at this time - continue to avoid nephrotoxic drugs like NSAIDS and dose adjust medications to be renally excreted - encourage hydration - bmp scheduled for 12/23  4. Peripheral vascular disease (HCC) - stable at this time, no skin breakdown, swelling or pain  5. Dysphagia, unspecified  type - seen by ST 12/21, daughter does not want to proceed with further workup - lung sounds clear, no coughing when drinking water, do no suspect aspiration at this time - continue to drink fluids when eating or taking medications - continue to sit upright when eating, may tuck chin to chest when swallowing  6. Coronary artery disease involving coronary bypass graft of native heart without angina pectoris - stable with medication - continue metoprolol, benicar, and statin regimen  7. Other iron deficiency anemia - asymptomatic - continue iron and folic acid regimen - recheck cbc/diff 12/23  Next appt: Visit date not found Isay Perleberg Scherry RanFargo, AGNP  [facility] [phone]

## 2020-01-21 NOTE — Progress Notes (Deleted)
Location:  [facility] Room Number: 511-P Place of Service:  SNF [phone]) Provider:  Hazle Nordmann, NP  Patient Care Team: Malka So., MD as PCP - General (Internal Medicine) Beverely Low, MD as Consulting Physician (Orthopedic Surgery) Durene Romans, MD as Consulting Physician (Orthopedic Surgery)  Extended Emergency Contact Information Primary Emergency Contact: Krantz,Mary Home Phone: [phone] Mobile Phone: [phone] Relation: Spouse Secondary Emergency Contact: Aggie Cosier Mobile Phone: [phone] Relation: Daughter  Code Status:  DNR Goals of care: Advanced Directive information Advanced Directives 01/19/2020  Does Patient Have a Medical Advance Directive? Yes  Type of Advance Directive Out of facility DNR (pink MOST or yellow form)  Does patient want to make changes to medical advance directive? No - Patient declined  Pre-existing out of facility DNR order (yellow form or pink MOST form) Yellow form placed in chart (order not valid for inpatient use)     Chief Complaint  Patient presents with  . Acute Visit    Patient is seen for hospital followup    HPI:  Pt is a 86 y.o. male seen today for a hospital f/u s/p admission from  Past Medical History:  Diagnosis Date  . Coronary artery disease   . Hypertension    Past Surgical History:  Procedure Laterality Date  . ABDOMINAL AORTIC ANEURYSM REPAIR    . COLECTOMY    . CORONARY ARTERY BYPASS GRAFT    . FEMUR IM NAIL Right 01/12/2020   Procedure: INTRAMEDULLARY (IM) NAIL FEMORAL;  Surgeon: Durene Romans, MD;  Location: [facility];  Service: Orthopedics;  Laterality: Right;  . HERNIA REPAIR      No Known Allergies  Outpatient Encounter Medications as of 01/21/2020  Medication Sig  . acetaminophen (TYLENOL) 500 MG tablet Take 1,000 mg by mouth 2 (two) times daily.  Marland Kitchen alum & mag hydroxide-simeth (MAALOX/MYLANTA) 200-200-20 MG/5ML suspension Take 30 mLs by mouth every 4  (four) hours as needed for indigestion.  . Amino Acids-Protein Hydrolys (FEEDING SUPPLEMENT, PRO-STAT 64,) LIQD Take 30 mLs by mouth 2 (two) times daily between meals.  Marland Kitchen amLODipine (NORVASC) 5 MG tablet Take 5 mg by mouth daily.  Marland Kitchen aspirin (ASPIRIN CHILDRENS) 81 MG chewable tablet Chew 1 tablet (81 mg total) by mouth in the morning and at bedtime for 28 days. Then resume normal dose of aspirin 81 mg daily.  . carvedilol (COREG) 6.25 MG tablet Take 6.25 mg by mouth 2 (two) times daily with a meal.  . docusate sodium (COLACE) 100 MG capsule Take 1 capsule (100 mg total) by mouth 2 (two) times daily.  . feeding supplement (ENSURE ENLIVE / ENSURE PLUS) LIQD Take 237 mLs by mouth 2 (two) times daily between meals.  . ferrous sulfate 325 (65 FE) MG tablet Take 1 tablet (325 mg total) by mouth 3 (three) times daily after meals for 14 days.  . folic acid (FOLVITE) 1 MG tablet Take 1 tablet (1 mg total) by mouth daily.  . Multiple Vitamin (MULTIVITAMIN WITH MINERALS) TABS tablet Take 1 tablet by mouth daily.  Marland Kitchen olmesartan (BENICAR) 20 MG tablet Take 20 mg by mouth daily.  . pravastatin (PRAVACHOL) 80 MG tablet Take 80 mg by mouth every evening.  . Travoprost, BAK Free, (TRAVATAN) 0.004 % SOLN ophthalmic solution Place 1 drop into both eyes at bedtime.  . [DISCONTINUED] HYDROcodone-acetaminophen (NORCO/VICODIN) 5-325 MG tablet Take 1 tablet by mouth every 6 (six) hours as needed.   No facility-administered encounter medications on file as of 01/21/2020.  Review of Systems  Immunization History  Administered Date(s) Administered  . Influenza Split 10/31/2003, 10/22/2007, 10/31/2009, 11/03/2013, 11/02/2014, 11/05/2017  . Influenza, High Dose Seasonal PF 10/26/2018, 10/26/2018  . Influenza, Seasonal, Injecte, Preservative Fre 10/31/2003, 10/22/2007, 10/31/2009, 11/03/2013, 11/02/2014  . Influenza-Unspecified 11/03/2013, 11/03/2013, 11/02/2014, 11/02/2014, 12/31/2019  . Moderna Sars-Covid-2  Vaccination 04/10/2019, 05/13/2019  . Pneumococcal Conjugate-13 12/01/2002, 09/16/2010  . Pneumococcal Polysaccharide-23 12/01/2002, 09/16/2010  . Pneumococcal-Unspecified 09/16/2010, 09/16/2010  . Td 12/20/2004, 12/20/2004  . Tdap 01/30/2009, 03/30/2009   Pertinent  Health Maintenance Due  Topic Date Due  . PNA vac Low Risk Adult (2 of 2 - PCV13) 09/16/2011  . INFLUENZA VACCINE  Completed   No flowsheet data found. Functional Status Survey:    Vitals:   01/21/20 1150  BP: (!) 105/47  Pulse: 75  Resp: 19  Temp: (!) 97.3 F (36.3 C)  TempSrc: Oral  SpO2: 97%  Weight: 158 lb 6.4 oz (71.8 kg)  Height: 5\' 10"  (1.778 m)   Body mass index is 22.73 kg/m. Physical Exam  Labs reviewed: Recent Labs    01/13/20 0436 01/14/20 0504 01/15/20 0453  NA 141 141 140  K 4.7 4.8 4.5  CL 110 110 111  CO2 21* 26 24  GLUCOSE 165* 133* 140*  BUN 29* 43* 53*  CREATININE 1.48* 1.73* 1.61*  CALCIUM 7.8* 7.8* 7.8*   Recent Labs    01/10/20 2114 01/13/20 0436  AST 22 20  ALT 17 13  ALKPHOS 55 42  BILITOT 0.3 0.7  PROT 6.6 5.4*  ALBUMIN 3.9 2.9*   Recent Labs    01/10/20 2114 01/11/20 1128 01/13/20 0436 01/14/20 0504 01/15/20 0453  WBC 11.7*   < > 12.4* 9.5 10.2  NEUTROABS 6.9  --   --   --   --   HGB 12.8*   < > 8.5* 7.4* 7.7*  HCT 38.3*   < > 26.8* 23.3* 24.4*  MCV 100.0   < > 105.5* 105.4* 106.1*  PLT 159   < > 114* 122* 148*   < > = values in this interval not displayed.   No results found for: TSH No results found for: HGBA1C No results found for: CHOL, HDL, LDLCALC, LDLDIRECT, TRIG, CHOLHDL  Significant Diagnostic Results in last 30 days:  DG Chest 1 View  Result Date: 01/10/2020 CLINICAL DATA:  Right hip fracture EXAM: CHEST  1 VIEW COMPARISON:  None. FINDINGS: Cardiomegaly. Prior CABG. Lungs are clear. No effusions. No acute bony abnormality. IMPRESSION: Cardiomegaly.  No acute cardiopulmonary disease. Electronically Signed   By: 14/12/2019 M.D.   On:  01/10/2020 21:47   CT Head Wo Contrast  Result Date: 01/10/2020 CLINICAL DATA:  Head trauma, fall sliding out of chair at home. EXAM: CT HEAD WITHOUT CONTRAST TECHNIQUE: Contiguous axial images were obtained from the base of the skull through the vertex without intravenous contrast. COMPARISON:  None. FINDINGS: Brain: Age related atrophy. Moderate periventricular and deep white matter hypodensity consistent with chronic small vessel ischemia. No intracranial hemorrhage, mass effect, or midline shift. No hydrocephalus. The basilar cisterns are patent. No evidence of territorial infarct or acute ischemia. No extra-axial or intracranial fluid collection. Vascular: Atherosclerosis of skullbase vasculature without hyperdense vessel or abnormal calcification. Skull: No fracture or focal lesion. Sinuses/Orbits: No acute findings. Rounded soft tissue density in posterior right ethmoid air cells and left maxillary sinus may represent small polyps. Mastoid air cells are clear. Bilateral cataract resection. Other: None. IMPRESSION: 1. No acute intracranial abnormality. No skull  fracture. 2. Age related atrophy and chronic small vessel ischemia. Electronically Signed   By: Narda Rutherford M.D.   On: 01/10/2020 21:41   CT Cervical Spine Wo Contrast  Result Date: 01/10/2020 CLINICAL DATA:  Cervical spine injury suspected, fall sliding out of chair at home. EXAM: CT CERVICAL SPINE WITHOUT CONTRAST TECHNIQUE: Multidetector CT imaging of the cervical spine was performed without intravenous contrast. Multiplanar CT image reconstructions were also generated. COMPARISON:  None. FINDINGS: The anterior most aspect of C7 vertebral body is excluded from the field of view and sagittal and coronal reformats, no abnormalities seen on AP imaging. Alignment: Exaggerated cervical lordosis. No listhesis or traumatic subluxation. Skull base and vertebrae: No acute fracture. Vertebral body heights are maintained. The dens and skull base  are intact. Degenerative pannus at C1-C2. Soft tissues and spinal canal: No prevertebral fluid or swelling. No visible canal hematoma. Disc levels: Mild disc space narrowing with endplate spurring at C5-C6. Additional endplate spurring with preservation of disc spaces. Multilevel facet hypertrophy. No bony canal stenosis. Upper chest: Negative. Other: Carotid calcifications. IMPRESSION: Mild degenerative change in the cervical spine without acute fracture or subluxation. Electronically Signed   By: Narda Rutherford M.D.   On: 01/10/2020 21:46   DG Hip Unilat W or Wo Pelvis 2-3 Views Right  Result Date: 01/10/2020 CLINICAL DATA:  Right hip pain.  Fall. EXAM: DG HIP (WITH OR WITHOUT PELVIS) 2-3V RIGHT COMPARISON:  None. FINDINGS: There is a right femoral intertrochanteric fracture. No significant angulation. No subluxation or dislocation. Degenerative changes in the hips bilaterally. IMPRESSION: Right femoral intertrochanteric fracture. Electronically Signed   By: Charlett Nose M.D.   On: 01/10/2020 20:24    Assessment/Plan There are no diagnoses linked to this encounter.   Family/ staff Communication: ***  Labs/tests ordered:  ***

## 2020-01-22 LAB — BASIC METABOLIC PANEL
BUN: 86 — AB (ref 4–21)
CO2: 23 — AB (ref 13–22)
Chloride: 106 (ref 99–108)
Creatinine: 1.9 — AB (ref 0.6–1.3)
Glucose: 115
Potassium: 4.3 (ref 3.4–5.3)
Sodium: 142 (ref 137–147)

## 2020-01-22 LAB — CBC AND DIFFERENTIAL
HCT: 24 — AB (ref 41–53)
Hemoglobin: 8 — AB (ref 13.5–17.5)
Platelets: 344 (ref 150–399)
WBC: 17

## 2020-01-22 LAB — COMPREHENSIVE METABOLIC PANEL: Calcium: 8.3 — AB (ref 8.7–10.7)

## 2020-01-22 LAB — CBC: RBC: 2.46 — AB (ref 3.87–5.11)

## 2020-01-25 LAB — BASIC METABOLIC PANEL
Creatinine: 1.7 — AB (ref 0.6–1.3)
Glucose: 128

## 2020-01-25 LAB — CBC AND DIFFERENTIAL
HCT: 26 — AB (ref 41–53)
Hemoglobin: 8.5 — AB (ref 13.5–17.5)
Platelets: 403 — AB (ref 150–399)
WBC: 18.7

## 2020-01-25 LAB — CBC: RBC: 2.59 — AB (ref 3.87–5.11)

## 2020-01-25 LAB — COMPREHENSIVE METABOLIC PANEL: Calcium: 8.3 — AB (ref 8.7–10.7)

## 2020-01-27 ENCOUNTER — Encounter: Payer: Self-pay | Admitting: Internal Medicine

## 2020-01-27 ENCOUNTER — Non-Acute Institutional Stay (SKILLED_NURSING_FACILITY): Payer: Medicare Other | Admitting: Internal Medicine

## 2020-01-27 DIAGNOSIS — R1310 Dysphagia, unspecified: Secondary | ICD-10-CM | POA: Diagnosis not present

## 2020-01-27 DIAGNOSIS — D508 Other iron deficiency anemias: Secondary | ICD-10-CM

## 2020-01-27 DIAGNOSIS — Z66 Do not resuscitate: Secondary | ICD-10-CM | POA: Diagnosis not present

## 2020-01-27 DIAGNOSIS — L89626 Pressure-induced deep tissue damage of left heel: Secondary | ICD-10-CM

## 2020-01-27 DIAGNOSIS — L84 Corns and callosities: Secondary | ICD-10-CM

## 2020-01-27 DIAGNOSIS — Z8781 Personal history of (healed) traumatic fracture: Secondary | ICD-10-CM

## 2020-01-27 DIAGNOSIS — I25810 Atherosclerosis of coronary artery bypass graft(s) without angina pectoris: Secondary | ICD-10-CM

## 2020-01-27 DIAGNOSIS — I739 Peripheral vascular disease, unspecified: Secondary | ICD-10-CM

## 2020-01-27 DIAGNOSIS — I10 Essential (primary) hypertension: Secondary | ICD-10-CM

## 2020-01-27 DIAGNOSIS — N1832 Chronic kidney disease, stage 3b: Secondary | ICD-10-CM

## 2020-01-27 NOTE — Progress Notes (Signed)
Provider:  Gwenith Spitz. Renato Gails, D.O., C.M.D. Location:  [facility] Room Number: 511-P Place of Service:  SNF (31)  PCP: Malka So., MD Patient Care Team: Malka So., MD as PCP - General (Internal Medicine) Beverely Low, MD as Consulting Physician (Orthopedic Surgery) Durene Romans, MD as Consulting Physician (Orthopedic Surgery)  Extended Emergency Contact Information Primary Emergency Contact: Grulke,Mary Home Phone: [phone] Mobile Phone: [phone] Relation: Spouse Secondary Emergency Contact: Aggie Cosier Mobile Phone: [phone] Relation: Daughter  Code Status: DNR Goals of Care: Advanced Directive information Advanced Directives 01/27/2020  Does Patient Have a Medical Advance Directive? Yes  Type of Advance Directive Out of facility DNR (pink MOST or yellow form)  Does patient want to make changes to medical advance directive? No - Patient declined  Pre-existing out of facility DNR order (yellow form or pink MOST form) Yellow form placed in chart (order not valid for inpatient use)      Chief Complaint  Patient presents with  . New Admit To SNF    New admit to [facility]     HPI: Patient is a 86 y.o. male seen today for admission to [facility] post hospitalization at [facility] from December 11 to December 17 for fall with right hip fracture.  He has a past medical history significant for hypertension, chronic kidney disease stage IIIb, peripheral vascular disease, coronary artery disease status post CABG.  He presented to the emergency department after he had a fall prior day.  Was trying to sit in a chair when his cat jumped off remittance caused him.  He slipped to the floor and hurt his hip.  He had no head injury or loss of consciousness and was able to remember the entire fall and events afterward.  He had no chest pain, palpitations, and dizziness prior to the fall.  Dr. Devonne Doughty was called  from orthopedics recommended transfer to [facility].  Hospitalist were called for admission.  In the emergency department his white blood cell count was 11.7, hemoglobin 12.8, creatinine 1.82.  CT of the head showed no acute intracranial abnormality, no skull fracture, and age-related atrophy with chronic small vessel ischemia.  He had mild degenerative change of his cervical spine without fracture or subluxation.  The right hip and pelvis x-rays revealed right femoral intertrochanteric fracture.  There was also question whether he was in atrial fibrillation in the emergency department.  He was taken to the OR for open reduction internal fixation of his hip on December 13 (right IM nail by Dr. Charlann Boxer).  He was noted to have anemia of chronic disease with some iron deficiency and elevated MCV.  B12 and folic acid were within normal range he was started on iron and folic acid supplements.  He had some thrombocytopenia which improved before discharge.  He is also followed by [facility] nephrology for his chronic kidney disease stage IIIb which was at baseline of 1.6 creatinine.  He comes to [place] on aspirin 81 mg p.o. twice daily for 28 days for DVT prophylaxis and will then resume his a aspirin 81 mg p.o. daily chewable thereafter.  He is on a 2-week course of iron twice daily.  For pain he is receiving Norco as needed but actually wanted to be weaned off of this 1 the nurse practitioner saw him last and is now on Tylenol 1000 mg p.o. twice daily as needed pain.  He is to follow-up with Dr.  Colon in 2 weeks.  Receiving PT OT.  He also had some difficulty with swallowing but this is improved with using large amounts of water.  He has had no choking spells reported by nursing.  He had his Covid vaccines on March 11 and he will 13th [place]).  When seen, he was resting in bed just before lunch with his daughter visiting.  He reports doing better with his swallowing with adequate fluids.  He has a tender area on  his right plantar surface of his foot where a callus is present and there is erythema.  His other heel is dressed due to pressure ulcer.  He does spend a lot of time in bed at his request though he does get up in the chair for meals.  His daughter requests comfort-focused goals for him at his advanced age.  He has declined taking any medications for anxiety or depression.   Past Medical History:  Diagnosis Date  . Chronic kidney disease, stage 3b (HCC) 01/21/2020  . Coronary artery disease   . Hypertension    Past Surgical History:  Procedure Laterality Date  . ABDOMINAL AORTIC ANEURYSM REPAIR    . COLECTOMY    . CORONARY ARTERY BYPASS GRAFT    . FEMUR IM NAIL Right 01/12/2020   Procedure: INTRAMEDULLARY (IM) NAIL FEMORAL;  Surgeon: Durene Romans, MD;  Location: [facility] ORS;  Service: Orthopedics;  Laterality: Right;  . HERNIA REPAIR      Social History   Socioeconomic History  . Marital status: Married    Spouse name: Not on file  . Number of children: Not on file  . Years of education: Not on file  . Highest education level: Not on file  Occupational History  . Not on file  Tobacco Use  . Smoking status: Former Games developer  . Smokeless tobacco: Never Used  . Tobacco comment: quit 50years ago  Vaping Use  . Vaping Use: Never used  Substance and Sexual Activity  . Alcohol use: Yes    Comment: socially  . Drug use: Never  . Sexual activity: Not Currently  Other Topics Concern  . Not on file  Social History Narrative  . Not on file   Social Determinants of Health   Financial Resource Strain: Not on file  Food Insecurity: Not on file  Transportation Needs: Not on file  Physical Activity: Not on file  Stress: Not on file  Social Connections: Not on file    reports that he has quit smoking. He has never used smokeless tobacco. He reports current alcohol use. He reports that he does not use drugs.  Functional Status Survey:    History reviewed. No pertinent family  history.  Health Maintenance  Topic Date Due  . PNA vac Low Risk Adult (2 of 2 - PCV13) 09/16/2011  . TETANUS/TDAP  03/31/2019  . INFLUENZA VACCINE  Completed  . COVID-19 Vaccine  Completed    No Known Allergies  Outpatient Encounter Medications as of 01/27/2020  Medication Sig  . acetaminophen (TYLENOL) 500 MG tablet Take 1,000 mg by mouth 2 (two) times daily.  Marland Kitchen alum & mag hydroxide-simeth (MAALOX/MYLANTA) 200-200-20 MG/5ML suspension Take 30 mLs by mouth every 4 (four) hours as needed for indigestion.  . Amino Acids-Protein Hydrolys (FEEDING SUPPLEMENT, PRO-STAT 64,) LIQD Take 30 mLs by mouth 2 (two) times daily between meals.  Marland Kitchen amLODipine (NORVASC) 5 MG tablet Take 5 mg by mouth daily.  Marland Kitchen aspirin (ASPIRIN CHILDRENS) 81 MG chewable tablet Chew 1 tablet (  81 mg total) by mouth in the morning and at bedtime for 28 days. Then resume normal dose of aspirin 81 mg daily.  . carvedilol (COREG) 6.25 MG tablet Take 6.25 mg by mouth 2 (two) times daily with a meal.  . docusate sodium (COLACE) 100 MG capsule Take 1 capsule (100 mg total) by mouth 2 (two) times daily.  . feeding supplement (ENSURE ENLIVE / ENSURE PLUS) LIQD Take 237 mLs by mouth 2 (two) times daily between meals.  . ferrous sulfate 325 (65 FE) MG tablet Take 1 tablet (325 mg total) by mouth 3 (three) times daily after meals for 14 days.  . folic acid (FOLVITE) 1 MG tablet Take 1 tablet (1 mg total) by mouth daily.  . Multiple Vitamin (MULTIVITAMIN WITH MINERALS) TABS tablet Take 1 tablet by mouth daily.  Marland Kitchen olmesartan (BENICAR) 20 MG tablet Take 20 mg by mouth daily.  . pravastatin (PRAVACHOL) 80 MG tablet Take 80 mg by mouth every evening.  . Travoprost, BAK Free, (TRAVATAN) 0.004 % SOLN ophthalmic solution Place 1 drop into both eyes at bedtime.   No facility-administered encounter medications on file as of 01/27/2020.    Review of Systems  Constitutional: Positive for malaise/fatigue. Negative for chills and fever.  HENT:  Positive for hearing loss. Negative for congestion and sore throat.        Hoarseness  Eyes:       Glasses  Respiratory: Positive for cough and hemoptysis. Negative for sputum production and shortness of breath.   Cardiovascular: Negative for chest pain, palpitations and leg swelling.  Gastrointestinal: Negative for abdominal pain, constipation and diarrhea.  Genitourinary: Negative for dysuria.  Musculoskeletal: Positive for falls. Negative for myalgias.       Right foot pain; right hip incision sites clean and dry without surrounding erythema or warmth  Skin: Negative for itching and rash.  Neurological: Positive for weakness. Negative for dizziness and loss of consciousness.  Endo/Heme/Allergies: Bruises/bleeds easily.  Psychiatric/Behavioral: Negative for depression. The patient is nervous/anxious. The patient does not have insomnia.     Vitals:   01/27/20 0838  BP: (!) 150/89  Pulse: 78  Temp: (!) 97.5 F (36.4 C)  Weight: 158 lb 6.4 oz (71.8 kg)  Height:  (1.778 m)   Body mass index is 22.73 kg/m. Physical Exam Vitals reviewed.  Constitutional:      General: He is not in acute distress.    Appearance: Normal appearance. He is not toxic-appearing.  HENT:     Head: Normocephalic and atraumatic.     Right Ear: External ear normal.     Left Ear: External ear normal.     Nose: No congestion.     Mouth/Throat:     Pharynx: Oropharynx is clear.  Eyes:     Extraocular Movements: Extraocular movements intact.     Conjunctiva/sclera: Conjunctivae normal.     Pupils: Pupils are equal, round, and reactive to light.     Comments: glasses  Cardiovascular:     Rate and Rhythm: Normal rate and regular rhythm.     Heart sounds: Murmur heard.    Pulmonary:     Effort: Pulmonary effort is normal.     Breath sounds: Rhonchi present. No wheezing or rales.  Abdominal:     General: Bowel sounds are normal.     Palpations: Abdomen is soft.     Tenderness: There is no  abdominal tenderness. There is no guarding or rebound.  Musculoskeletal:  General: Normal range of motion.     Right lower leg: No edema.     Left lower leg: No edema.  Skin:    General: Skin is warm and dry.     Comments: Left heel pressure injury with cushioned dressing in place; right plantar surface of foot with erythema at site of callous (resident slides down in bed so feet touch footboard and has to frequently be pulled back up)  Neurological:     General: No focal deficit present.     Mental Status: He is alert and oriented to person, place, and time. Mental status is at baseline.     Motor: Weakness present.     Gait: Gait abnormal.     Comments: Uses walker   Psychiatric:        Mood and Affect: Mood normal.        Thought Content: Thought content normal.        Judgment: Judgment normal.     Labs reviewed: Basic Metabolic Panel: Recent Labs    01/13/20 0436 01/14/20 0504 01/15/20 0453  NA 141 141 140  K 4.7 4.8 4.5  CL 110 110 111  CO2 21* 26 24  GLUCOSE 165* 133* 140*  BUN 29* 43* 53*  CREATININE 1.48* 1.73* 1.61*  CALCIUM 7.8* 7.8* 7.8*   Liver Function Tests: Recent Labs    01/10/20 2114 01/13/20 0436  AST 22 20  ALT 17 13  ALKPHOS 55 42  BILITOT 0.3 0.7  PROT 6.6 5.4*  ALBUMIN 3.9 2.9*   No results for input(s): LIPASE, AMYLASE in the last 8760 hours. No results for input(s): AMMONIA in the last 8760 hours. CBC: Recent Labs    01/10/20 2114 01/11/20 1128 01/13/20 0436 01/14/20 0504 01/15/20 0453  WBC 11.7*   < > 12.4* 9.5 10.2  NEUTROABS 6.9  --   --   --   --   HGB 12.8*   < > 8.5* 7.4* 7.7*  HCT 38.3*   < > 26.8* 23.3* 24.4*  MCV 100.0   < > 105.5* 105.4* 106.1*  PLT 159   < > 114* 122* 148*   < > = values in this interval not displayed.   Cardiac Enzymes: No results for input(s): CKTOTAL, CKMB, CKMBINDEX, TROPONINI in the last 8760 hours. BNP: Invalid input(s): POCBNP No results found for: HGBA1C No results found for:  TSH Lab Results  Component Value Date   VITAMINB12 1,133 (H) 01/13/2020   Lab Results  Component Value Date   FOLATE 6.1 01/13/2020   Lab Results  Component Value Date   IRON 16 (L) 01/13/2020   TIBC 190 (L) 01/13/2020   FERRITIN 283 01/13/2020    Imaging and Procedures obtained prior to SNF admission: DG Chest 1 View  Result Date: 01/10/2020 CLINICAL DATA:  Right hip fracture EXAM: CHEST  1 VIEW COMPARISON:  None. FINDINGS: Cardiomegaly. Prior CABG. Lungs are clear. No effusions. No acute bony abnormality. IMPRESSION: Cardiomegaly.  No acute cardiopulmonary disease. Electronically Signed   By: Charlett Nose M.D.   On: 01/10/2020 21:47   CT Head Wo Contrast  Result Date: 01/10/2020 CLINICAL DATA:  Head trauma, fall sliding out of chair at home. EXAM: CT HEAD WITHOUT CONTRAST TECHNIQUE: Contiguous axial images were obtained from the base of the skull through the vertex without intravenous contrast. COMPARISON:  None. FINDINGS: Brain: Age related atrophy. Moderate periventricular and deep white matter hypodensity consistent with chronic small vessel ischemia. No intracranial hemorrhage, mass effect, or  midline shift. No hydrocephalus. The basilar cisterns are patent. No evidence of territorial infarct or acute ischemia. No extra-axial or intracranial fluid collection. Vascular: Atherosclerosis of skullbase vasculature without hyperdense vessel or abnormal calcification. Skull: No fracture or focal lesion. Sinuses/Orbits: No acute findings. Rounded soft tissue density in posterior right ethmoid air cells and left maxillary sinus may represent small polyps. Mastoid air cells are clear. Bilateral cataract resection. Other: None. IMPRESSION: 1. No acute intracranial abnormality. No skull fracture. 2. Age related atrophy and chronic small vessel ischemia. Electronically Signed   By: Narda RutherfordMelanie  Sanford M.D.   On: 01/10/2020 21:41   CT Cervical Spine Wo Contrast  Result Date: 01/10/2020 CLINICAL  DATA:  Cervical spine injury suspected, fall sliding out of chair at home. EXAM: CT CERVICAL SPINE WITHOUT CONTRAST TECHNIQUE: Multidetector CT imaging of the cervical spine was performed without intravenous contrast. Multiplanar CT image reconstructions were also generated. COMPARISON:  None. FINDINGS: The anterior most aspect of C7 vertebral body is excluded from the field of view and sagittal and coronal reformats, no abnormalities seen on AP imaging. Alignment: Exaggerated cervical lordosis. No listhesis or traumatic subluxation. Skull base and vertebrae: No acute fracture. Vertebral body heights are maintained. The dens and skull base are intact. Degenerative pannus at C1-C2. Soft tissues and spinal canal: No prevertebral fluid or swelling. No visible canal hematoma. Disc levels: Mild disc space narrowing with endplate spurring at C5-C6. Additional endplate spurring with preservation of disc spaces. Multilevel facet hypertrophy. No bony canal stenosis. Upper chest: Negative. Other: Carotid calcifications. IMPRESSION: Mild degenerative change in the cervical spine without acute fracture or subluxation. Electronically Signed   By: Narda RutherfordMelanie  Sanford M.D.   On: 01/10/2020 21:46   DG Hip Unilat W or Wo Pelvis 2-3 Views Right  Result Date: 01/10/2020 CLINICAL DATA:  Right hip pain.  Fall. EXAM: DG HIP (WITH OR WITHOUT PELVIS) 2-3V RIGHT COMPARISON:  None. FINDINGS: There is a right femoral intertrochanteric fracture. No significant angulation. No subluxation or dislocation. Degenerative changes in the hips bilaterally. IMPRESSION: Right femoral intertrochanteric fracture. Electronically Signed   By: Charlett NoseKevin  Dover M.D.   On: 01/10/2020 20:24    Assessment/Plan 1. DNR (do not resuscitate) - Do not attempt resuscitation (DNR) -goals are comfort-based and to get back home  2. Callus of foot -Right plantar callus--offload pressure with bunny boots, foam dressing, positional changes  3. S/P right hip  fracture -recovering gradually, not very active/mobile and was not prior either--does a lot more sedentary activities at his advanced age  804. Dysphagia, unspecified type -cont aspiration precautions and modifications per ST--aggressive testing and investigations not wanted by pt and family  5. Chronic kidney disease, stage 3b (HCC) -Avoid nephrotoxic agents like nsaids, dose adjust renally excreted meds, hydrate.   6. Coronary artery disease involving coronary bypass graft of native heart without angina pectoris -cont baby asa, coreg, pravachol  7. Peripheral vascular disease (HCC) -at risk for further skin break down and poor healing due to this so offloading pressure especially important for him  8. Other iron deficiency anemia -cont iron tid with meals  9. Essential hypertension -generally satisfactory, cont same regimen and monitor  10. Pressure injury of deep tissue of left heel Left heel wound--cont wound care    Family/ staff Communication: d/w his daughter in room and snf nurse  Labs/tests ordered:  No additional added--NP had ordered and f/u on labs  Javyon Fontan L. Aurore Redinger, D.O. Geriatrics [facility] [address]  [place], [place] [address] Cell Phone (Mon-Fri 8am-5pm):  [phone] On Call:  [phone] & follow prompts after 5pm & weekends Office Phone:  [phone] Office Fax:  [phone]

## 2020-01-29 ENCOUNTER — Encounter: Payer: Self-pay | Admitting: Orthopedic Surgery

## 2020-01-29 ENCOUNTER — Non-Acute Institutional Stay (SKILLED_NURSING_FACILITY): Payer: Medicare Other | Admitting: Orthopedic Surgery

## 2020-01-29 DIAGNOSIS — I25810 Atherosclerosis of coronary artery bypass graft(s) without angina pectoris: Secondary | ICD-10-CM

## 2020-01-29 DIAGNOSIS — I10 Essential (primary) hypertension: Secondary | ICD-10-CM

## 2020-01-29 DIAGNOSIS — Z8781 Personal history of (healed) traumatic fracture: Secondary | ICD-10-CM

## 2020-01-29 DIAGNOSIS — H40053 Ocular hypertension, bilateral: Secondary | ICD-10-CM

## 2020-01-29 DIAGNOSIS — N1832 Chronic kidney disease, stage 3b: Secondary | ICD-10-CM

## 2020-01-29 DIAGNOSIS — R1310 Dysphagia, unspecified: Secondary | ICD-10-CM

## 2020-01-29 DIAGNOSIS — I739 Peripheral vascular disease, unspecified: Secondary | ICD-10-CM | POA: Diagnosis not present

## 2020-01-29 DIAGNOSIS — D508 Other iron deficiency anemias: Secondary | ICD-10-CM

## 2020-01-29 DIAGNOSIS — E440 Moderate protein-calorie malnutrition: Secondary | ICD-10-CM

## 2020-01-29 DIAGNOSIS — D72829 Elevated white blood cell count, unspecified: Secondary | ICD-10-CM

## 2020-01-29 MED ORDER — PRO-STAT 64 PO LIQD
30.0000 mL | Freq: Two times a day (BID) | ORAL | 0 refills | Status: AC
Start: 2020-01-29 — End: ?

## 2020-01-29 MED ORDER — CARVEDILOL 6.25 MG PO TABS: 6.2500 mg | ORAL_TABLET | Freq: Two times a day (BID) | ORAL | 0 refills | Status: AC

## 2020-01-29 MED ORDER — AMLODIPINE BESYLATE 5 MG PO TABS: 5.0000 mg | ORAL_TABLET | Freq: Every day | ORAL | 0 refills | Status: AC

## 2020-01-29 MED ORDER — TRAVOPROST (BAK FREE) 0.004 % OP SOLN
1.0000 [drp] | Freq: Every day | OPHTHALMIC | 0 refills | Status: AC
Start: 2020-01-29 — End: ?

## 2020-01-29 MED ORDER — OLMESARTAN MEDOXOMIL 20 MG PO TABS: 20.0000 mg | ORAL_TABLET | Freq: Every day | ORAL | 0 refills | Status: AC

## 2020-01-29 MED ORDER — PRAVASTATIN SODIUM 80 MG PO TABS
80.0000 mg | ORAL_TABLET | Freq: Every evening | ORAL | 0 refills | Status: AC
Start: 2020-01-29 — End: ?

## 2020-01-29 NOTE — Progress Notes (Signed)
Location:    [facility] Room Number: 511/P Place of Service:  SNF [phone])  Provider: Hazle Nordmann, AGNP-C  PCP: Malka So., MD Patient Care Team: Malka So., MD as PCP - General (Internal Medicine) Beverely Low, MD as Consulting Physician (Orthopedic Surgery) Durene Romans, MD as Consulting Physician (Orthopedic Surgery)  Extended Emergency Contact Information Primary Emergency Contact: Dilks,Mary Home Phone: [phone] Mobile Phone: [phone] Relation: Spouse Secondary Emergency Contact: Aggie Cosier Mobile Phone: [phone] Relation: Daughter  Code Status: DNR Goals of care:  Advanced Directive information Advanced Directives 01/29/2020  Does Patient Have a Medical Advance Directive? Yes  Type of Advance Directive Out of facility DNR (pink MOST or yellow form)  Does patient want to make changes to medical advance directive? No - Patient declined  Pre-existing out of facility DNR order (yellow form or pink MOST form) Yellow form placed in chart (order not valid for inpatient use)     No Known Allergies  Chief Complaint  Patient presents with  . Discharge Note    Discharge Visit    HPI:  86 y.o. Jacob [name] seen today for discharge evaluation.  He has been a resident of [facility], seen today at bedside. PMH includes: hypertension, peripheral vascular disease, coronary artery disease with CABG, dysphagia, hip fracture, chronic kidney disease stage 3b, malnutrition, anemia and history of fall.   He is postoperative day 17 from right IM nail performed by Dr. Charlann Boxer. During his stay at [facility] he has received PT/OT/ST and skilled nursing services. At this time he is able to ambulate 60 feet with minimal assist using a front wheeled walker. Pain is minimal, narcotics discontinued last week, uses tylenol prn for pain. Having daily bowel movements. Seen by Dr. Charlann Boxer 12/27, advised RLE PWB and to return in 4  weeks. Upset his right foot is stiff and not as mobile. Foam dressing to right plantar callus placed 12/28 for pressure injury, bunny boots ordered.   He was seen by speech therapy a few times for dysphagia. He states at the beginning of his stay he was having difficulty swallowing and sometimes has pain when he swallows food. At this time he remains on a regular diet with thin liquids. ST advised to take big sips of water when swallowing food.   WBC has also been elevated throughout his stay. 18.7 on 12/26. He does not present with any signs of infection. Chest x-ray done 12/23 negative for aspiration pneumonia. Hip incision no signs of infection. UA and culture unremarkable. Covid-19 negative. Has remained afebrile throughout stay.   No falls or injuries reported.   Recent blood pressures are as follows:  12/30- 133/74  12/29- 131/65  12/28- 142/72  Facility nurse does not report any concerns, vitals stable.   At this time I believe he is appropriate for discharge. Home health PT/OT/Nursing ordered. DME needs of rolling walker and 3&1 communicated with social work and ordered. He plans to discharge home, wife and daughter main support systems. I have advised him to continue scheduled f/u with Dr. Charlann Boxer and schedule f/u with PCP in 1-2 weeks to recheck cbc.     Past Medical History:  Diagnosis Date  . Chronic kidney disease, stage 3b (HCC) 01/21/2020  . Coronary artery disease   . Hypertension     Past Surgical History:  Procedure Laterality Date  . ABDOMINAL AORTIC ANEURYSM REPAIR    . COLECTOMY    . CORONARY ARTERY BYPASS GRAFT    .  FEMUR IM NAIL Right 01/12/2020   Procedure: INTRAMEDULLARY (IM) NAIL FEMORAL;  Surgeon: Durene Romanslin, Matthew, MD;  Location: [facility] ORS;  Service: Orthopedics;  Laterality: Right;  . HERNIA REPAIR        reports that he has quit smoking. He has never used smokeless tobacco. He reports current alcohol use. He reports that he does not use drugs. Social History    Socioeconomic History  . Marital status: Married    Spouse name: Not on file  . Number of children: Not on file  . Years of education: Not on file  . Highest education level: Not on file  Occupational History  . Not on file  Tobacco Use  . Smoking status: Former Games developermoker  . Smokeless tobacco: Never Used  . Tobacco comment: quit 50years ago  Vaping Use  . Vaping Use: Never used  Substance and Sexual Activity  . Alcohol use: Yes    Comment: socially  . Drug use: Never  . Sexual activity: Not Currently  Other Topics Concern  . Not on file  Social History Narrative  . Not on file   Social Determinants of Health   Financial Resource Strain: Not on file  Food Insecurity: Not on file  Transportation Needs: Not on file  Physical Activity: Not on file  Stress: Not on file  Social Connections: Not on file  Intimate Partner Violence: Not on file   Functional Status Survey:    No Known Allergies  Pertinent  Health Maintenance Due  Topic Date Due  . PNA vac Low Risk Adult (2 of 2 - PCV13) 09/16/2011  . INFLUENZA VACCINE  Completed    Medications: Allergies as of 01/29/2020   No Known Allergies     Medication List       Accurate as of January 29, 2020  9:00 AM. If you have any questions, ask your nurse or doctor.        STOP taking these medications   ferrous sulfate 325 (65 FE) MG tablet Stopped by: Octavia HeirAmy E Marcela Alatorre, NP     TAKE these medications   acetaminophen 500 MG tablet Commonly known as: TYLENOL Take 1,000 mg by mouth 2 (two) times daily.   alum & mag hydroxide-simeth 200-200-20 MG/5ML suspension Commonly known as: MAALOX/MYLANTA Take 30 mLs by mouth every 4 (four) hours as needed for indigestion.   amLODipine 5 MG tablet Commonly known as: NORVASC Take 5 mg by mouth daily.   aspirin 81 MG chewable tablet Commonly known as: Aspirin Childrens Chew 1 tablet (81 mg total) by mouth in the morning and at bedtime for 28 days. Then resume normal dose of  aspirin 81 mg daily.   carvedilol 6.25 MG tablet Commonly known as: COREG Take 6.25 mg by mouth 2 (two) times daily with a meal.   docusate sodium 100 MG capsule Commonly known as: COLACE Take 1 capsule (100 mg total) by mouth 2 (two) times daily.   feeding supplement (PRO-STAT 64) Liqd Take 30 mLs by mouth 2 (two) times daily between meals.   feeding supplement Liqd Take 237 mLs by mouth 2 (two) times daily between meals.   folic acid 1 MG tablet Commonly known as: FOLVITE Take 1 tablet (1 mg total) by mouth daily.   multivitamin with minerals Tabs tablet Take 1 tablet by mouth daily.   olmesartan 20 MG tablet Commonly known as: BENICAR Take 20 mg by mouth daily.   pravastatin 80 MG tablet Commonly known as: PRAVACHOL Take 80 mg by mouth  every evening.   Travoprost (BAK Free) 0.004 % Soln ophthalmic solution Commonly known as: TRAVATAN Place 1 drop into both eyes at bedtime.       Review of Systems  Constitutional: Negative for activity change, appetite change and fever.  HENT: Positive for hearing loss and trouble swallowing.   Eyes: Negative for visual disturbance.  Respiratory: Negative for cough, shortness of breath and wheezing.   Cardiovascular: Negative for chest pain and leg swelling.  Gastrointestinal: Negative for abdominal pain, constipation, diarrhea and nausea.  Genitourinary: Negative for dysuria and hematuria.  Musculoskeletal:       Right foot stiffness, right hip pain  Skin:       Surgical incision, pressure injury to right heel  Neurological: Positive for weakness. Negative for dizziness and headaches.  Psychiatric/Behavioral: Negative for dysphoric mood and sleep disturbance. The patient is not nervous/anxious.     Vitals:   01/29/20 0856  BP: (!) 146/84  Pulse: 76  Resp: 18  Temp: (!) 97.5 F (36.4 C)  SpO2: 97%  Weight: 156 lb (70.8 kg)  Height: 5\' 10"  (1.778 m)   Body mass index is 22.38 kg/m. Physical Exam Vitals reviewed.   Constitutional:      General: He is not in acute distress. HENT:     Head: Normocephalic.     Right Ear: There is no impacted cerumen.     Left Ear: There is no impacted cerumen.     Nose: Nose normal.     Mouth/Throat:     Mouth: Mucous membranes are moist.     Pharynx: No posterior oropharyngeal erythema.  Eyes:     General:        Right eye: No discharge.        Left eye: No discharge.  Cardiovascular:     Rate and Rhythm: Normal rate. Rhythm irregular.     Pulses: Normal pulses.     Heart sounds: Normal heart sounds. No murmur heard.   Pulmonary:     Effort: Pulmonary effort is normal. No respiratory distress.     Breath sounds: Normal breath sounds. No wheezing.  Abdominal:     General: Abdomen is flat. Bowel sounds are normal.     Palpations: Abdomen is soft.  Musculoskeletal:     Cervical back: Normal range of motion.     Right lower leg: No edema.     Left lower leg: No edema.     Comments: Right hip incision CDI, closed, no staples, no drainage. Surrounding tissue intact.   Lymphadenopathy:     Cervical: No cervical adenopathy.  Skin:    General: Skin is warm and dry.     Capillary Refill: Capillary refill takes less than 2 seconds.     Comments: Right heel with small non-blanchable area, nickel sized. Foam dressing CDI. No open areas or drainage.   Neurological:     General: No focal deficit present.     Mental Status: He is alert and oriented to person, place, and time.     Motor: Weakness present.     Gait: Gait abnormal.  Psychiatric:        Mood and Affect: Mood normal.        Behavior: Behavior normal.        Thought Content: Thought content normal.        Judgment: Judgment normal.     Labs reviewed: Basic Metabolic Panel: Recent Labs    01/13/20 0436 01/14/20 0504 01/15/20 0453 01/22/20 0000 01/25/20 0000  NA 141 141 140 142  --   K 4.7 4.8 4.5 4.3  --   CL 110 110 111 106  --   CO2 21* 26 24 23*  --   GLUCOSE 165* 133* 140*  --   --    BUN 29* 43* 53* 86*  --   CREATININE 1.48* 1.73* 1.61* 1.9* 1.7*  CALCIUM 7.8* 7.8* 7.8* 8.3* 8.3*   Liver Function Tests: Recent Labs    01/10/20 2114 01/13/20 0436  AST 22 20  ALT 17 13  ALKPHOS 55 42  BILITOT 0.3 0.7  PROT 6.6 5.4*  ALBUMIN 3.9 2.9*   No results for input(s): LIPASE, AMYLASE in the last 8760 hours. No results for input(s): AMMONIA in the last 8760 hours. CBC: Recent Labs    01/10/20 2114 01/11/20 1128 01/13/20 0436 01/14/20 0504 01/15/20 0453 01/22/20 0000 01/25/20 0000  WBC 11.7*   < > 12.4* 9.5 10.2 17.0 18.7  NEUTROABS 6.9  --   --   --   --   --   --   HGB 12.8*   < > 8.5* 7.4* 7.7* 8.0* 8.5*  HCT 38.3*   < > 26.8* 23.3* 24.4* 24* 26*  MCV 100.0   < > 105.5* 105.4* 106.1*  --   --   PLT 159   < > 114* 122* 148* 344 403*   < > = values in this interval not displayed.   Cardiac Enzymes: No results for input(s): CKTOTAL, CKMB, CKMBINDEX, TROPONINI in the last 8760 hours. BNP: Invalid input(s): POCBNP CBG: No results for input(s): GLUCAP in the last 8760 hours.  Procedures and Imaging Studies During Stay: DG Chest 1 View  Result Date: 01/10/2020 CLINICAL DATA:  Right hip fracture EXAM: CHEST  1 VIEW COMPARISON:  None. FINDINGS: Cardiomegaly. Prior CABG. Lungs are clear. No effusions. No acute bony abnormality. IMPRESSION: Cardiomegaly.  No acute cardiopulmonary disease. Electronically Signed   By: Charlett Nose M.D.   On: 01/10/2020 21:47   CT Head Wo Contrast  Result Date: 01/10/2020 CLINICAL DATA:  Head trauma, fall sliding out of chair at home. EXAM: CT HEAD WITHOUT CONTRAST TECHNIQUE: Contiguous axial images were obtained from the base of the skull through the vertex without intravenous contrast. COMPARISON:  None. FINDINGS: Brain: Age related atrophy. Moderate periventricular and deep white matter hypodensity consistent with chronic small vessel ischemia. No intracranial hemorrhage, mass effect, or midline shift. No hydrocephalus. The  basilar cisterns are patent. No evidence of territorial infarct or acute ischemia. No extra-axial or intracranial fluid collection. Vascular: Atherosclerosis of skullbase vasculature without hyperdense vessel or abnormal calcification. Skull: No fracture or focal lesion. Sinuses/Orbits: No acute findings. Rounded soft tissue density in posterior right ethmoid air cells and left maxillary sinus may represent small polyps. Mastoid air cells are clear. Bilateral cataract resection. Other: None. IMPRESSION: 1. No acute intracranial abnormality. No skull fracture. 2. Age related atrophy and chronic small vessel ischemia. Electronically Signed   By: Narda Rutherford M.D.   On: 01/10/2020 21:41   CT Cervical Spine Wo Contrast  Result Date: 01/10/2020 CLINICAL DATA:  Cervical spine injury suspected, fall sliding out of chair at home. EXAM: CT CERVICAL SPINE WITHOUT CONTRAST TECHNIQUE: Multidetector CT imaging of the cervical spine was performed without intravenous contrast. Multiplanar CT image reconstructions were also generated. COMPARISON:  None. FINDINGS: The anterior most aspect of C7 vertebral body is excluded from the field of view and sagittal and coronal reformats, no abnormalities seen on AP imaging.  Alignment: Exaggerated cervical lordosis. No listhesis or traumatic subluxation. Skull base and vertebrae: No acute fracture. Vertebral body heights are maintained. The dens and skull base are intact. Degenerative pannus at C1-C2. Soft tissues and spinal canal: No prevertebral fluid or swelling. No visible canal hematoma. Disc levels: Mild disc space narrowing with endplate spurring at C5-C6. Additional endplate spurring with preservation of disc spaces. Multilevel facet hypertrophy. No bony canal stenosis. Upper chest: Negative. Other: Carotid calcifications. IMPRESSION: Mild degenerative change in the cervical spine without acute fracture or subluxation. Electronically Signed   By: Narda Rutherford M.D.   On:  01/10/2020 21:46   DG C-Arm 1-60 Min-No Report  Result Date: 01/12/2020 CLINICAL DATA:  Right hip fracture. EXAM: OPERATIVE right HIP (WITH PELVIS IF PERFORMED) 7 VIEWS TECHNIQUE: Fluoroscopic spot image(s) were submitted for interpretation post-operatively. Radiation exposure index: 3.7353 mGy. COMPARISON:  January 10, 2020. FINDINGS: Seven intraoperative fluoroscopic images of the right hip demonstrate surgical internal fixation of intertrochanteric fracture involving the proximal right femur. Good alignment of fracture components is noted. IMPRESSION: Status post surgical internal fixation of proximal right femoral intertrochanteric fracture. Electronically Signed   By: Lupita Raider M.D.   On: 01/12/2020 19:13   DG HIP OPERATIVE UNILAT W OR W/O PELVIS RIGHT  Result Date: 01/12/2020 CLINICAL DATA:  Right hip fracture. EXAM: OPERATIVE right HIP (WITH PELVIS IF PERFORMED) 7 VIEWS TECHNIQUE: Fluoroscopic spot image(s) were submitted for interpretation post-operatively. Radiation exposure index: 3.7353 mGy. COMPARISON:  January 10, 2020. FINDINGS: Seven intraoperative fluoroscopic images of the right hip demonstrate surgical internal fixation of intertrochanteric fracture involving the proximal right femur. Good alignment of fracture components is noted. IMPRESSION: Status post surgical internal fixation of proximal right femoral intertrochanteric fracture. Electronically Signed   By: Lupita Raider M.D.   On: 01/12/2020 19:13   DG Hip Unilat W or Wo Pelvis 2-3 Views Right  Result Date: 01/10/2020 CLINICAL DATA:  Right hip pain.  Fall. EXAM: DG HIP (WITH OR WITHOUT PELVIS) 2-3V RIGHT COMPARISON:  None. FINDINGS: There is a right femoral intertrochanteric fracture. No significant angulation. No subluxation or dislocation. Degenerative changes in the hips bilaterally. IMPRESSION: Right femoral intertrochanteric fracture. Electronically Signed   By: Charlett Nose M.D.   On: 01/10/2020 20:24     Assessment/Plan:   1. S/P right hip fracture - followed by Dr. Charlann Boxer - stable, no signs of infection - continue tylenol 650 mg po q6hrs for pain - continue 81 mg aspirin po bid - stop date 02/13/20 - home health PT/OT  2. Essential hypertension - at goal < 150/90 - continue amlodipine - continue low sodium diet - amLODipine (NORVASC) 5 MG tablet; Take 1 tablet (5 mg total) by mouth daily.  Dispense: 30 tablet; Refill: 0 - carvedilol (COREG) 6.25 MG tablet; Take 1 tablet (6.25 mg total) by mouth 2 (two) times daily with a meal.  Dispense: 60 tablet; Refill: 0  3. Chronic kidney disease, stage 3b (HCC) - baseline creatinine 1.6, followed by [facility] - advised to f/u post hospitalization, no appointment yet - continue to avoid nephrotoxic drugs like NSAIDS and dose adjust medications to be renally excreted  4. Peripheral vascular disease (HCC) - stable at this time, no lower extremity swelling, leg pain minimal  5. Dysphagia, unspecified type - seen by ST - continue regular diet with thin liquids - suggest big sipd of water with food - chest x-ray negative for aspiration  6. Other iron deficiency anemia - ferrous sulfate 325  mg po bid given for 14 days- end date 01/30/20 - continue folic acid daily  7. Leukocytosis, unspecified type - wbc elevated with no signs of infection - remains afebrile, chest x-ray unremarkable, covid-19 negative, ua and culture unremarkable - suggest f/u cbc/diff with PCP in 1-2 weeks  8. Malnutrition of moderate degree - continue prostat and ensure - Amino Acids-Protein Hydrolys (FEEDING SUPPLEMENT, PRO-STAT 64,) LIQD; Take 30 mLs by mouth 2 (two) times daily between meals.  Dispense: 1800 mL; Refill: 0  9. Coronary artery disease involving coronary bypass graft of native heart without angina pectoris - stable, no reported chest pain - olmesartan (BENICAR) 20 MG tablet; Take 1 tablet (20 mg total) by mouth daily.  Dispense: 30  tablet; Refill: 0 - pravastatin (PRAVACHOL) 80 MG tablet; Take 1 tablet (80 mg total) by mouth every evening.  Dispense: 30 tablet; Refill: 0  10. Disease of both eyes characterized by increased eye pressure - stable with medication, continue travoprost drops in both eyes daily - Travoprost, BAK Free, (TRAVATAN) 0.004 % SOLN ophthalmic solution; Place 1 drop into both eyes at bedtime.  Dispense: 2 mL; Refill: 0    Patient is being discharged with the following home health services:PT/OT/Nursing   Patient is being discharged with the following durable medical equipment: rolling walker and 3&1   Patient has been advised to f/u with their PCP in 1-2 weeks to bring them up to date on their rehab stay.  Social services at facility was responsible for arranging this appointment.  Pt was provided with a 30 day supply of prescriptions for medications and refills must be obtained from their PCP.  For controlled substances, a more limited supply may be provided adequate until PCP appointment only.  Future labs/tests needed:  Cbc/diff, bmp

## 2020-02-21 ENCOUNTER — Emergency Department (HOSPITAL_BASED_OUTPATIENT_CLINIC_OR_DEPARTMENT_OTHER): Payer: Medicare Other

## 2020-02-21 ENCOUNTER — Encounter (HOSPITAL_BASED_OUTPATIENT_CLINIC_OR_DEPARTMENT_OTHER): Payer: Self-pay

## 2020-02-21 ENCOUNTER — Inpatient Hospital Stay (HOSPITAL_BASED_OUTPATIENT_CLINIC_OR_DEPARTMENT_OTHER)
Admission: EM | Admit: 2020-02-21 | Discharge: 2020-03-02 | DRG: 535 | Disposition: E | Payer: Medicare Other | Attending: Family Medicine | Admitting: Family Medicine

## 2020-02-21 DIAGNOSIS — Z66 Do not resuscitate: Secondary | ICD-10-CM | POA: Diagnosis present

## 2020-02-21 DIAGNOSIS — I82452 Acute embolism and thrombosis of left peroneal vein: Secondary | ICD-10-CM | POA: Diagnosis present

## 2020-02-21 DIAGNOSIS — R1310 Dysphagia, unspecified: Secondary | ICD-10-CM | POA: Diagnosis present

## 2020-02-21 DIAGNOSIS — S72012A Unspecified intracapsular fracture of left femur, initial encounter for closed fracture: Principal | ICD-10-CM | POA: Diagnosis present

## 2020-02-21 DIAGNOSIS — E785 Hyperlipidemia, unspecified: Secondary | ICD-10-CM | POA: Diagnosis present

## 2020-02-21 DIAGNOSIS — E875 Hyperkalemia: Secondary | ICD-10-CM | POA: Diagnosis present

## 2020-02-21 DIAGNOSIS — I739 Peripheral vascular disease, unspecified: Secondary | ICD-10-CM | POA: Diagnosis present

## 2020-02-21 DIAGNOSIS — I255 Ischemic cardiomyopathy: Secondary | ICD-10-CM | POA: Diagnosis present

## 2020-02-21 DIAGNOSIS — I5041 Acute combined systolic (congestive) and diastolic (congestive) heart failure: Secondary | ICD-10-CM | POA: Diagnosis not present

## 2020-02-21 DIAGNOSIS — R0902 Hypoxemia: Secondary | ICD-10-CM

## 2020-02-21 DIAGNOSIS — R627 Adult failure to thrive: Secondary | ICD-10-CM | POA: Diagnosis present

## 2020-02-21 DIAGNOSIS — M25552 Pain in left hip: Secondary | ICD-10-CM | POA: Diagnosis present

## 2020-02-21 DIAGNOSIS — I2693 Single subsegmental pulmonary embolism without acute cor pulmonale: Secondary | ICD-10-CM | POA: Diagnosis present

## 2020-02-21 DIAGNOSIS — I2510 Atherosclerotic heart disease of native coronary artery without angina pectoris: Secondary | ICD-10-CM | POA: Diagnosis present

## 2020-02-21 DIAGNOSIS — I25810 Atherosclerosis of coronary artery bypass graft(s) without angina pectoris: Secondary | ICD-10-CM | POA: Diagnosis not present

## 2020-02-21 DIAGNOSIS — L89152 Pressure ulcer of sacral region, stage 2: Secondary | ICD-10-CM | POA: Diagnosis present

## 2020-02-21 DIAGNOSIS — S72002A Fracture of unspecified part of neck of left femur, initial encounter for closed fracture: Secondary | ICD-10-CM | POA: Diagnosis present

## 2020-02-21 DIAGNOSIS — W19XXXA Unspecified fall, initial encounter: Secondary | ICD-10-CM

## 2020-02-21 DIAGNOSIS — Z515 Encounter for palliative care: Secondary | ICD-10-CM

## 2020-02-21 DIAGNOSIS — I959 Hypotension, unspecified: Secondary | ICD-10-CM | POA: Diagnosis present

## 2020-02-21 DIAGNOSIS — Z8679 Personal history of other diseases of the circulatory system: Secondary | ICD-10-CM

## 2020-02-21 DIAGNOSIS — N1832 Chronic kidney disease, stage 3b: Secondary | ICD-10-CM | POA: Diagnosis present

## 2020-02-21 DIAGNOSIS — I130 Hypertensive heart and chronic kidney disease with heart failure and stage 1 through stage 4 chronic kidney disease, or unspecified chronic kidney disease: Secondary | ICD-10-CM | POA: Diagnosis present

## 2020-02-21 DIAGNOSIS — H9190 Unspecified hearing loss, unspecified ear: Secondary | ICD-10-CM | POA: Diagnosis present

## 2020-02-21 DIAGNOSIS — L89626 Pressure-induced deep tissue damage of left heel: Secondary | ICD-10-CM | POA: Diagnosis present

## 2020-02-21 DIAGNOSIS — D509 Iron deficiency anemia, unspecified: Secondary | ICD-10-CM | POA: Diagnosis present

## 2020-02-21 DIAGNOSIS — I82412 Acute embolism and thrombosis of left femoral vein: Secondary | ICD-10-CM

## 2020-02-21 DIAGNOSIS — Z6823 Body mass index (BMI) 23.0-23.9, adult: Secondary | ICD-10-CM

## 2020-02-21 DIAGNOSIS — Y92003 Bedroom of unspecified non-institutional (private) residence as the place of occurrence of the external cause: Secondary | ICD-10-CM | POA: Diagnosis not present

## 2020-02-21 DIAGNOSIS — D539 Nutritional anemia, unspecified: Secondary | ICD-10-CM | POA: Diagnosis present

## 2020-02-21 DIAGNOSIS — W06XXXA Fall from bed, initial encounter: Secondary | ICD-10-CM | POA: Diagnosis present

## 2020-02-21 DIAGNOSIS — E440 Moderate protein-calorie malnutrition: Secondary | ICD-10-CM | POA: Diagnosis present

## 2020-02-21 DIAGNOSIS — I4891 Unspecified atrial fibrillation: Secondary | ICD-10-CM | POA: Diagnosis not present

## 2020-02-21 DIAGNOSIS — Z20822 Contact with and (suspected) exposure to covid-19: Secondary | ICD-10-CM | POA: Diagnosis present

## 2020-02-21 DIAGNOSIS — Z8781 Personal history of (healed) traumatic fracture: Secondary | ICD-10-CM

## 2020-02-21 DIAGNOSIS — R54 Age-related physical debility: Secondary | ICD-10-CM | POA: Diagnosis present

## 2020-02-21 DIAGNOSIS — Z01810 Encounter for preprocedural cardiovascular examination: Secondary | ICD-10-CM | POA: Diagnosis not present

## 2020-02-21 DIAGNOSIS — Z79899 Other long term (current) drug therapy: Secondary | ICD-10-CM

## 2020-02-21 DIAGNOSIS — I10 Essential (primary) hypertension: Secondary | ICD-10-CM | POA: Diagnosis not present

## 2020-02-21 DIAGNOSIS — R778 Other specified abnormalities of plasma proteins: Secondary | ICD-10-CM | POA: Diagnosis not present

## 2020-02-21 DIAGNOSIS — L8990 Pressure ulcer of unspecified site, unspecified stage: Secondary | ICD-10-CM | POA: Insufficient documentation

## 2020-02-21 DIAGNOSIS — Z87891 Personal history of nicotine dependence: Secondary | ICD-10-CM

## 2020-02-21 DIAGNOSIS — Z951 Presence of aortocoronary bypass graft: Secondary | ICD-10-CM

## 2020-02-21 DIAGNOSIS — R7989 Other specified abnormal findings of blood chemistry: Secondary | ICD-10-CM | POA: Diagnosis not present

## 2020-02-21 DIAGNOSIS — Z538 Procedure and treatment not carried out for other reasons: Secondary | ICD-10-CM | POA: Diagnosis not present

## 2020-02-21 DIAGNOSIS — Z7982 Long term (current) use of aspirin: Secondary | ICD-10-CM

## 2020-02-21 DIAGNOSIS — I480 Paroxysmal atrial fibrillation: Secondary | ICD-10-CM | POA: Diagnosis not present

## 2020-02-21 LAB — CBC WITH DIFFERENTIAL/PLATELET
Abs Immature Granulocytes: 0.07 10*3/uL (ref 0.00–0.07)
Basophils Absolute: 0 10*3/uL (ref 0.0–0.1)
Basophils Relative: 0 %
Eosinophils Absolute: 0 10*3/uL (ref 0.0–0.5)
Eosinophils Relative: 0 %
HCT: 28.2 % — ABNORMAL LOW (ref 39.0–52.0)
Hemoglobin: 8.8 g/dL — ABNORMAL LOW (ref 13.0–17.0)
Immature Granulocytes: 1 %
Lymphocytes Relative: 31 %
Lymphs Abs: 4.5 10*3/uL — ABNORMAL HIGH (ref 0.7–4.0)
MCH: 34.2 pg — ABNORMAL HIGH (ref 26.0–34.0)
MCHC: 31.2 g/dL (ref 30.0–36.0)
MCV: 109.7 fL — ABNORMAL HIGH (ref 80.0–100.0)
Monocytes Absolute: 1.1 10*3/uL — ABNORMAL HIGH (ref 0.1–1.0)
Monocytes Relative: 7 %
Neutro Abs: 8.7 10*3/uL — ABNORMAL HIGH (ref 1.7–7.7)
Neutrophils Relative %: 61 %
Platelets: 173 10*3/uL (ref 150–400)
RBC: 2.57 MIL/uL — ABNORMAL LOW (ref 4.22–5.81)
RDW: 15.9 % — ABNORMAL HIGH (ref 11.5–15.5)
WBC: 14.4 10*3/uL — ABNORMAL HIGH (ref 4.0–10.5)
nRBC: 0 % (ref 0.0–0.2)

## 2020-02-21 LAB — COMPREHENSIVE METABOLIC PANEL
ALT: 16 U/L (ref 0–44)
AST: 16 U/L (ref 15–41)
Albumin: 2.9 g/dL — ABNORMAL LOW (ref 3.5–5.0)
Alkaline Phosphatase: 141 U/L — ABNORMAL HIGH (ref 38–126)
Anion gap: 9 (ref 5–15)
BUN: 63 mg/dL — ABNORMAL HIGH (ref 8–23)
CO2: 23 mmol/L (ref 22–32)
Calcium: 8.3 mg/dL — ABNORMAL LOW (ref 8.9–10.3)
Chloride: 105 mmol/L (ref 98–111)
Creatinine, Ser: 1.93 mg/dL — ABNORMAL HIGH (ref 0.61–1.24)
GFR, Estimated: 32 mL/min — ABNORMAL LOW (ref >60)
Glucose, Bld: 166 mg/dL — ABNORMAL HIGH (ref 70–99)
Potassium: 4.6 mmol/L (ref 3.5–5.1)
Sodium: 137 mmol/L (ref 135–145)
Total Bilirubin: 0.3 mg/dL (ref 0.3–1.2)
Total Protein: 5.4 g/dL — ABNORMAL LOW (ref 6.5–8.1)

## 2020-02-21 LAB — TROPONIN I (HIGH SENSITIVITY)
Troponin I (High Sensitivity): 29 ng/L — ABNORMAL HIGH (ref <18)
Troponin I (High Sensitivity): 38 ng/L — ABNORMAL HIGH (ref <18)

## 2020-02-21 LAB — SARS CORONAVIRUS 2 BY RT PCR (HOSPITAL ORDER, PERFORMED IN ~~LOC~~ HOSPITAL LAB): SARS Coronavirus 2: NEGATIVE

## 2020-02-21 MED ORDER — MORPHINE SULFATE (PF) 2 MG/ML IV SOLN: 0.5000 mg | INTRAVENOUS | Status: DC | PRN

## 2020-02-21 MED ORDER — ACETAMINOPHEN 500 MG PO TABS
1000.0000 mg | ORAL_TABLET | Freq: Two times a day (BID) | ORAL | Status: DC
  Administered 2020-02-22 – 2020-02-23 (×2): 1000 mg via ORAL
  Filled 2020-02-21 (×3): qty 2

## 2020-02-21 MED ORDER — AMLODIPINE BESYLATE 5 MG PO TABS: 5.0000 mg | ORAL_TABLET | Freq: Every day | ORAL | Status: DC

## 2020-02-21 MED ORDER — LATANOPROST 0.005 % OP SOLN
1.0000 [drp] | Freq: Every day | OPHTHALMIC | Status: DC
  Administered 2020-02-22 – 2020-02-23 (×2): 1 [drp] via OPHTHALMIC
  Filled 2020-02-21: qty 2.5

## 2020-02-21 MED ORDER — ALUM & MAG HYDROXIDE-SIMETH 200-200-20 MG/5ML PO SUSP: 30.0000 mL | ORAL | Status: DC | PRN

## 2020-02-21 MED ORDER — FOLIC ACID 1 MG PO TABS
1.0000 mg | ORAL_TABLET | Freq: Every day | ORAL | Status: DC
  Administered 2020-02-22 – 2020-02-23 (×2): 1 mg via ORAL
  Filled 2020-02-21 (×2): qty 1

## 2020-02-21 MED ORDER — ADULT MULTIVITAMIN W/MINERALS CH
1.0000 | ORAL_TABLET | Freq: Every day | ORAL | Status: DC
  Administered 2020-02-23: 1 via ORAL
  Filled 2020-02-21 (×2): qty 1

## 2020-02-21 MED ORDER — FENTANYL CITRATE (PF) 100 MCG/2ML IJ SOLN
25.0000 ug | Freq: Once | INTRAMUSCULAR | Status: AC
  Administered 2020-02-21: 25 ug via INTRAVENOUS
  Filled 2020-02-21: qty 2

## 2020-02-21 MED ORDER — SODIUM CHLORIDE 0.9 % IV BOLUS
500.0000 mL | Freq: Once | INTRAVENOUS | Status: AC
  Administered 2020-02-21: 500 mL via INTRAVENOUS

## 2020-02-21 MED ORDER — PRAVASTATIN SODIUM 40 MG PO TABS
80.0000 mg | ORAL_TABLET | Freq: Every evening | ORAL | Status: DC
  Administered 2020-02-22 – 2020-02-23 (×2): 80 mg via ORAL
  Filled 2020-02-21 (×2): qty 2

## 2020-02-21 MED ORDER — FENTANYL CITRATE (PF) 100 MCG/2ML IJ SOLN
25.0000 ug | Freq: Once | INTRAMUSCULAR | Status: AC
Start: 2020-02-21 — End: 2020-02-21
  Administered 2020-02-21: 25 ug via INTRAVENOUS
  Filled 2020-02-21: qty 2

## 2020-02-21 MED ORDER — DOCUSATE SODIUM 100 MG PO CAPS
100.0000 mg | ORAL_CAPSULE | Freq: Two times a day (BID) | ORAL | Status: DC
  Administered 2020-02-22 – 2020-02-23 (×2): 100 mg via ORAL
  Filled 2020-02-21 (×3): qty 1

## 2020-02-21 MED ORDER — CARVEDILOL 6.25 MG PO TABS
6.2500 mg | ORAL_TABLET | Freq: Two times a day (BID) | ORAL | Status: DC
Start: 2020-02-21 — End: 2020-02-24
  Administered 2020-02-21 – 2020-02-23 (×5): 6.25 mg via ORAL
  Filled 2020-02-21 (×5): qty 1

## 2020-02-21 MED ORDER — ENOXAPARIN SODIUM 40 MG/0.4ML ~~LOC~~ SOLN
40.0000 mg | Freq: Once | SUBCUTANEOUS | Status: AC
  Administered 2020-02-21: 40 mg via SUBCUTANEOUS
  Filled 2020-02-21: qty 0.4

## 2020-02-21 MED ORDER — HYDROCODONE-ACETAMINOPHEN 5-325 MG PO TABS
1.0000 | ORAL_TABLET | Freq: Four times a day (QID) | ORAL | Status: DC | PRN
  Administered 2020-02-23: 1 via ORAL
  Filled 2020-02-21: qty 1

## 2020-02-21 NOTE — ED Provider Notes (Signed)
[facility] EMERGENCY DEPARTMENT Provider Note   CSN: [id number] Arrival date & time: 02/08/2020  1156     History Chief Complaint  Patient presents with  . Fall    Jacob [name] is a 87 y.o. male.  HPI      87 year old male with history of hypertension, coronary artery disease, right hip fracture with IM nailing with Dr. Charlann Boxerlin in December who left [facility] 2 weeks ago, presents from home with concern for fall and left hip pain.  Wife reports that he had 1 fall shortly after the [facility], and had another fall early this morning.  He was trying to get out of bed to get his urinal, and slid out of bed falling and hurting his left hip. Had some superficial laceration behind ear.  He denies headache, loss of consciousness, neck pain, chest pain, shortness of breath, abdominal pain, nausea, vomiting, numbness, weakness, cough, urinary symptoms, recent illness.  Wife reports that he had described chest pain immediately after the fall that has now resolved.  He denies any lightheadedness or shortness of breath.  Wife reports that he has chronic black-colored stool and had been taking iron without change.  They note his blood pressures have been running lower, so his PCP had discontinued some of his blood pressure medications, although he did take his other blood pressure medications this morning.  She thinks her blood his blood pressures are normally in the 100s, he reports that his current blood pressure is 95 systolic are not abnormal.  Reports left hip pain, but also points towards his left lower quadrant and flank.  He has contusions over his lower abdomen, is unclear if these are new or old.    Past Medical History:  Diagnosis Date  . Chronic kidney disease, stage 3b (HCC) 01/21/2020  . Coronary artery disease   . Hypertension     Patient Active Problem List   Diagnosis Date Noted  . Closed left hip fracture (HCC) 02/25/2020  . Essential  hypertension 01/21/2020  . Chronic kidney disease, stage 3b (HCC) 01/21/2020  . Peripheral vascular disease (HCC) 01/21/2020  . Dysphagia 01/21/2020  . Coronary artery disease involving coronary bypass graft of native heart without angina pectoris 01/21/2020  . Absolute anemia 01/21/2020  . S/P right hip fracture 01/19/2020  . Surgery, elective   . Fall   . Malnutrition of moderate degree 01/13/2020  . Hip fracture (HCC) 01/10/2020    Past Surgical History:  Procedure Laterality Date  . ABDOMINAL AORTIC ANEURYSM REPAIR    . COLECTOMY    . CORONARY ARTERY BYPASS GRAFT    . FEMUR IM NAIL Right 01/12/2020   Procedure: INTRAMEDULLARY (IM) NAIL FEMORAL;  Surgeon: Durene Romanslin, Matthew, MD;  Location: [facility];  Service: Orthopedics;  Laterality: Right;  . HERNIA REPAIR         No family history on file.  Social History   Tobacco Use  . Smoking status: Former Games developermoker  . Smokeless tobacco: Never Used  . Tobacco comment: quit 50years ago  Vaping Use  . Vaping Use: Never used  Substance Use Topics  . Alcohol use: Yes    Comment: socially  . Drug use: Never    Home Medications Prior to Admission medications   Medication Sig Start Date End Date Taking? Authorizing Provider  acetaminophen (TYLENOL) 500 MG tablet Take 1,000 mg by mouth 2 (two) times daily. 01/19/20   [provider]  alum & mag hydroxide-simeth (MAALOX/MYLANTA)  200-200-20 MG/5ML suspension Take 30 mLs by mouth every 4 (four) hours as needed for indigestion. 01/16/20   Arnetha Courser, MD  Amino Acids-Protein Hydrolys (FEEDING SUPPLEMENT, PRO-STAT 64,) LIQD Take 30 mLs by mouth 2 (two) times daily between meals. 01/29/20   Fargo, Amy E, NP  amLODipine (NORVASC) 5 MG tablet Take 1 tablet (5 mg total) by mouth daily. 01/29/20   Fargo, Amy E, NP  carvedilol (COREG) 6.25 MG tablet Take 1 tablet (6.25 mg total) by mouth 2 (two) times daily with a meal. 01/29/20   Fargo, Amy E, NP  docusate sodium (COLACE) 100 MG capsule  Take 1 capsule (100 mg total) by mouth 2 (two) times daily. 01/16/20   Arnetha Courser, MD  feeding supplement (ENSURE ENLIVE / ENSURE PLUS) LIQD Take 237 mLs by mouth 2 (two) times daily between meals. 01/16/20   Arnetha Courser, MD  folic acid (FOLVITE) 1 MG tablet Take 1 tablet (1 mg total) by mouth daily. 01/16/20   Arnetha Courser, MD  Multiple Vitamin (MULTIVITAMIN WITH MINERALS) TABS tablet Take 1 tablet by mouth daily. 01/16/20   Arnetha Courser, MD  olmesartan (BENICAR) 20 MG tablet Take 1 tablet (20 mg total) by mouth daily. 01/29/20   Fargo, Amy E, NP  pravastatin (PRAVACHOL) 80 MG tablet Take 1 tablet (80 mg total) by mouth every evening. 01/29/20   Fargo, Amy E, NP  Travoprost, BAK Free, (TRAVATAN) 0.004 % SOLN ophthalmic solution Place 1 drop into both eyes at bedtime. 01/29/20   Octavia Heir, NP    Allergies    Patient has no known allergies.  Review of Systems   Review of Systems  Constitutional: Negative for fatigue and fever.  HENT: Negative for sore throat.   Eyes: Negative for visual disturbance.  Respiratory: Negative for shortness of breath.   Cardiovascular: Negative for chest pain.  Gastrointestinal: Positive for abdominal pain. Negative for blood in stool (chronic black stool per wife on iron), nausea and vomiting.  Genitourinary: Negative for difficulty urinating and dysuria.  Musculoskeletal: Positive for arthralgias and back pain. Negative for neck stiffness.  Skin: Negative for rash.  Neurological: Negative for syncope and headaches.    Physical Exam Updated Vital Signs BP 124/83 (BP Location: Left Arm)   Pulse 84   Temp 97.9 F (36.6 C) (Oral)   Resp 17   SpO2 94%   Physical Exam Vitals and nursing note reviewed.  Constitutional:      General: He is not in acute distress.    Appearance: He is well-developed and well-nourished. He is not diaphoretic.  HENT:     Head: Normocephalic.     Comments: Superficial laceration behind left ear Eyes:      Extraocular Movements: EOM normal.     Conjunctiva/sclera: Conjunctivae normal.  Cardiovascular:     Rate and Rhythm: Normal rate. Rhythm irregular.     Pulses: Normal pulses and intact distal pulses.     Heart sounds: Normal heart sounds.  Pulmonary:     Effort: Pulmonary effort is normal. No respiratory distress.     Breath sounds: Normal breath sounds. No wheezing or rales.  Abdominal:     General: There is no distension.     Palpations: Abdomen is soft.     Tenderness: There is no abdominal tenderness. There is no guarding.  Musculoskeletal:        General: No edema.     Cervical back: Normal range of motion.     Comments: Left hip with pain  with minimal ROM attempted Bilateral LE edema   Skin:    General: Skin is warm and dry.  Neurological:     Mental Status: He is alert and oriented to person, place, and time.     ED Results / Procedures / Treatments   Labs (all labs ordered are listed, but only abnormal results are displayed) Labs Reviewed  CBC WITH DIFFERENTIAL/PLATELET - Abnormal; Notable for the following components:      Result Value   WBC 14.4 (*)    RBC 2.57 (*)    Hemoglobin 8.8 (*)    HCT 28.2 (*)    MCV 109.7 (*)    MCH 34.2 (*)    RDW 15.9 (*)    Neutro Abs 8.7 (*)    Lymphs Abs 4.5 (*)    Monocytes Absolute 1.1 (*)    All other components within normal limits  COMPREHENSIVE METABOLIC PANEL - Abnormal; Notable for the following components:   Glucose, Bld 166 (*)    BUN 63 (*)    Creatinine, Ser 1.93 (*)    Calcium 8.3 (*)    Total Protein 5.4 (*)    Albumin 2.9 (*)    Alkaline Phosphatase 141 (*)    GFR, Estimated 32 (*)    All other components within normal limits  TROPONIN I (HIGH SENSITIVITY) - Abnormal; Notable for the following components:   Troponin I (High Sensitivity) 29 (*)    All other components within normal limits  SARS CORONAVIRUS 2 BY RT PCR (HOSPITAL ORDER, PERFORMED IN  HOSPITAL LAB)  URINALYSIS, ROUTINE W REFLEX  MICROSCOPIC  TROPONIN I (HIGH SENSITIVITY)    EKG None  Radiology CT ABDOMEN PELVIS WO CONTRAST  Result Date: 02/02/2020 CLINICAL DATA:  Fall, left hip pain, evaluate for retroperitoneal hematoma EXAM: CT ABDOMEN AND PELVIS WITHOUT CONTRAST TECHNIQUE: Multidetector CT imaging of the abdomen and pelvis was performed following the standard protocol without IV contrast. COMPARISON:  Right hip radiographs dated 01/12/2020. CT abdomen/pelvis dated 08/11/2013. FINDINGS: Lower chest: Trace bilateral pleural effusions. Hepatobiliary: Unenhanced liver is unremarkable. Gallbladder is unremarkable. No intrahepatic or extrahepatic ductal dilatation. Pancreas: Within normal limits. Spleen: Within normal limits. Adrenals/Urinary Tract: Adrenal glands are within normal limits. Bilateral renal cysts, including a 7.9 cm exophytic right lower pole renal cyst (series 3/image 38). No hydronephrosis. Bladder is underdistended but unremarkable. Stomach/Bowel: Stomach is within normal limits. No evidence of bowel obstruction. Status post ileocecal resection with appendectomy. Sigmoid diverticulosis, without evidence of diverticulitis. Vascular/Lymphatic: Status post aorta bi-iliac stent repair of and abdominal aortic aneurysm. Maximal AP diameter is 5.2 cm, previously 4.9 cm in 2015, grossly unchanged. Atherosclerotic calcifications of the abdominal aorta and branch vessels. No suspicious abdominopelvic lymphadenopathy Reproductive: Prostate is grossly unremarkable. Other: No abdominopelvic ascites. Musculoskeletal: Acute subcapital left hip fracture with mild foreshortening and impaction (coronal image 58). Subacute intertrochanteric right hip fracture, s/p ORIF, with mild fragmentation anteriorly involving the greater trochanter. Mild degenerative changes of the visualized thoracolumbar spine. IMPRESSION: No evidence of retroperitoneal hematoma. Acute subcapital left hip fracture, as above. Subacute intertrochanteric right hip  fracture status post ORIF, as above. Electronically Signed   By: Charline Bills M.D.   On: 02/25/2020 14:02   DG Chest 1 View  Result Date: 03/01/2020 CLINICAL DATA:  Fall. EXAM: CHEST  1 VIEW COMPARISON:  01/10/2020 FINDINGS: Stable mild cardiac enlargement. Status post prior CABG. There is no evidence of pulmonary edema, consolidation, pneumothorax, nodule or pleural fluid. Visualized bony structures are unremarkable. IMPRESSION: Stable mild  cardiac enlargement. No acute findings. Electronically Signed   By: Irish LackGlenn  Yamagata M.D.   On: 09-20-20 13:51   CT Head Wo Contrast  Result Date: Jan 23, 2021 CLINICAL DATA:  Patient status post fall. EXAM: CT HEAD WITHOUT CONTRAST CT CERVICAL SPINE WITHOUT CONTRAST TECHNIQUE: Multidetector CT imaging of the head and cervical spine was performed following the standard protocol without intravenous contrast. Multiplanar CT image reconstructions of the cervical spine were also generated. COMPARISON:  Brain and C-spine CT 01/10/2020. FINDINGS: CT HEAD FINDINGS Brain: Ventricles and sulci are appropriate for patient's age. Periventricular and subcortical white matter hypodensity compatible with chronic microvascular ischemic changes. No evidence for acute cortically based infarct, intracranial hemorrhage, mass lesion or mass-effect. Vascular: Unremarkable Skull: Intact. Sinuses/Orbits: Paranasal sinuses well aerated. Mastoid air cells unremarkable. Orbits unremarkable. Other: None CT CERVICAL SPINE FINDINGS Alignment: Mild grade 1 anterolisthesis C4 on C5. Otherwise normal anatomic alignment. Skull base and vertebrae: Degenerative changes.  Intact. Soft tissues and spinal canal: No prevertebral fluid or swelling. No visible canal hematoma. Disc levels:  Degenerative disc disease most pronounced C5-6. Upper chest: Unremarkable. Other: None. IMPRESSION: 1. No acute intracranial process. Atrophy and chronic microvascular ischemic changes. 2. No acute cervical spine  fracture. Degenerative disc disease. Electronically Signed   By: Annia Beltrew  Davis M.D.   On: 09-20-20 13:47   CT Cervical Spine Wo Contrast  Result Date: Jan 23, 2021 CLINICAL DATA:  Patient status post fall. EXAM: CT HEAD WITHOUT CONTRAST CT CERVICAL SPINE WITHOUT CONTRAST TECHNIQUE: Multidetector CT imaging of the head and cervical spine was performed following the standard protocol without intravenous contrast. Multiplanar CT image reconstructions of the cervical spine were also generated. COMPARISON:  Brain and C-spine CT 01/10/2020. FINDINGS: CT HEAD FINDINGS Brain: Ventricles and sulci are appropriate for patient's age. Periventricular and subcortical white matter hypodensity compatible with chronic microvascular ischemic changes. No evidence for acute cortically based infarct, intracranial hemorrhage, mass lesion or mass-effect. Vascular: Unremarkable Skull: Intact. Sinuses/Orbits: Paranasal sinuses well aerated. Mastoid air cells unremarkable. Orbits unremarkable. Other: None CT CERVICAL SPINE FINDINGS Alignment: Mild grade 1 anterolisthesis C4 on C5. Otherwise normal anatomic alignment. Skull base and vertebrae: Degenerative changes.  Intact. Soft tissues and spinal canal: No prevertebral fluid or swelling. No visible canal hematoma. Disc levels:  Degenerative disc disease most pronounced C5-6. Upper chest: Unremarkable. Other: None. IMPRESSION: 1. No acute intracranial process. Atrophy and chronic microvascular ischemic changes. 2. No acute cervical spine fracture. Degenerative disc disease. Electronically Signed   By: Annia Beltrew  Davis M.D.   On: 09-20-20 13:47   DG Hip Unilat W or Wo Pelvis 2-3 Views Left  Result Date: Jan 23, 2021 CLINICAL DATA:  Status post fall. EXAM: DG HIP (WITH OR WITHOUT PELVIS) 2-3V LEFT COMPARISON:  None. FINDINGS: There is fracture of the left femoral neck. Patient status post prior fixation of proximal right femur. No other acute fracture or dislocation is identified. IMPRESSION:  Fracture of left femoral neck. Electronically Signed   By: Sherian ReinWei-Chen  Lin M.D.   On: 09-20-20 13:48    Procedures Procedures (including critical care time)  Medications Ordered in ED Medications  acetaminophen (TYLENOL) tablet 1,000 mg (has no administration in time range)  carvedilol (COREG) tablet 6.25 mg (has no administration in time range)  enoxaparin (LOVENOX) injection 40 mg (has no administration in time range)  sodium chloride 0.9 % bolus 500 mL (0 mLs Intravenous Stopped 03-23-2020 1416)  fentaNYL (SUBLIMAZE) injection 25 mcg (25 mcg Intravenous Given 03-23-2020 1442)    ED Course  I have reviewed  the triage vital signs and the nursing notes.  Pertinent labs & imaging results that were available during my care of the patient were reviewed by me and considered in my medical decision making (see chart for details).    MDM Rules/Calculators/A&P                          87 year old male with history of hypertension, coronary artery disease, right hip fracture with IM nailing with Dr. Charlann Boxer in December who left [facility] 2 weeks ago, presents from home with concern for fall and left hip pain.  CT head, cervical spine, abdomen pelvis showed no evidence of intracranial bleed, spinal fracture or other acute abnormalities.  Chest x-ray shows no evidence of pulmonary edema or other abnormalities.  X-ray of the hip shows left femoral neck fracture.  He is neurovascularly intact.  Denies any other acute medical concerns, family did note LE edema and that he had reported brief CP at home after fall. Troponin slight elevation likely in setting of CKD, age, however will recheck. EKG shows new onset atrial fibrillation with rate in 70s which may contribute to other findings.  Given surgery tomorrow he is not candidate for full dose anticoagulation at this time and given fall risk may be discussion of whether or not they would like this as outpatient.  Discussed fracture with Dr. Shon Baton of  [facility] who will contact Dr. Charlann Boxer or other orthopedic physician regarding scheduling repair, however reports it will not be today. Plan at this time will be NPO at midnight. Will continue to hold for bed here at [facility], ordered dose of DVT ppx, carvedilol. Will hold other bp meds at this time.  Plan for admission to hospitalist.  If holding in ED overnight may require ED to ED transfer prior to AM for surgery pending surgical plan.    Final Clinical Impression(s) / ED Diagnoses Final diagnoses:  Fall, initial encounter  Left displaced femoral neck fracture (HCC)  Atrial fibrillation, unspecified type [facility])    Rx / DC Orders ED Discharge Orders    None       Alvira Monday, MD 02/10/2020 1635

## 2020-02-21 NOTE — ED Notes (Signed)
His extremities are very cool at all times. I am having some difficulty in obtaining consistent SPO2 waveform. Pt. Remains in no distress. As I write this, Dr. Dalene Seltzer is fielding questions from pt.'s wife and their daughter (per phone).

## 2020-02-21 NOTE — ED Triage Notes (Signed)
EMS reports that pt. Has fallen twice in the pst 24 hours. His family were concerned after his most recent fall that he was unable to "walk as good as usual". He mentioned left hip pain to EMS, however, here it is not clear where the pain is coming from. He was able to stand and bear his weight with assist on scene. He is awake, alert and oriented to all except day/date. He has what appears to be a superficial abrasion/lac. Behind his left ear.

## 2020-02-21 NOTE — H&P (Signed)
History and Physical    Jacob [name] RUE:[id number] DOB: 1924/03/06 DOA: 02/17/2020  PCP: Malka So., MD  Patient coming from: Home  I have personally briefly reviewed patient's old medical records in [facility]  Chief Complaint: Fall, hip fracture  HPI: Jacob [name] is a 87 y.o. male with medical history significant of CAD, CKD 3b, HTN.  Pt had fall and R hip fx just last month.  Repaired by Dr. Charlann Boxer.  Had been at [facility] for a while for rehab after hip fx and just had returned home 2 weeks ago.  Today had another mechanical fall, this time severe L hip pain and inability to ambulate after fall.  Denies CP, SOB.   ED Course: HGB 8.8 (stable from last month), Creat 1.9 (also stable).  Imaging reveals acute LEFT hip fx.  Also new onset A.Fib on EKG, rate controlled.  Also slight trop elevation, no CP though.  Pt given 1 dose of lovenox.  Transferred to [facility].   Review of Systems: As per HPI, otherwise all review of systems negative.  Past Medical History:  Diagnosis Date  . Chronic kidney disease, stage 3b (HCC) 01/21/2020  . Coronary artery disease   . Hypertension     Past Surgical History:  Procedure Laterality Date  . ABDOMINAL AORTIC ANEURYSM REPAIR    . COLECTOMY    . CORONARY ARTERY BYPASS GRAFT    . FEMUR IM NAIL Right 01/12/2020   Procedure: INTRAMEDULLARY (IM) NAIL FEMORAL;  Surgeon: Durene Romans, MD;  Location: [facility];  Service: Orthopedics;  Laterality: Right;  . HERNIA REPAIR       reports that he has quit smoking. He has never used smokeless tobacco. He reports current alcohol use. He reports that he does not use drugs.  No Known Allergies  No family history on file.   Prior to Admission medications   Medication Sig Start Date End Date Taking? Authorizing Provider  acetaminophen (TYLENOL) 500 MG tablet Take 1,000 mg by mouth 2 (two) times daily. 01/19/20   [provider]  alum & mag hydroxide-simeth (MAALOX/MYLANTA)  200-200-20 MG/5ML suspension Take 30 mLs by mouth every 4 (four) hours as needed for indigestion. 01/16/20   Arnetha Courser, MD  Amino Acids-Protein Hydrolys (FEEDING SUPPLEMENT, PRO-STAT 64,) LIQD Take 30 mLs by mouth 2 (two) times daily between meals. 01/29/20   Fargo, Amy E, NP  amLODipine (NORVASC) 5 MG tablet Take 1 tablet (5 mg total) by mouth daily. 01/29/20   Fargo, Amy E, NP  carvedilol (COREG) 6.25 MG tablet Take 1 tablet (6.25 mg total) by mouth 2 (two) times daily with a meal. 01/29/20   Fargo, Amy E, NP  docusate sodium (COLACE) 100 MG capsule Take 1 capsule (100 mg total) by mouth 2 (two) times daily. 01/16/20   Arnetha Courser, MD  feeding supplement (ENSURE ENLIVE / ENSURE PLUS) LIQD Take 237 mLs by mouth 2 (two) times daily between meals. 01/16/20   Arnetha Courser, MD  folic acid (FOLVITE) 1 MG tablet Take 1 tablet (1 mg total) by mouth daily. 01/16/20   Arnetha Courser, MD  Multiple Vitamin (MULTIVITAMIN WITH MINERALS) TABS tablet Take 1 tablet by mouth daily. 01/16/20   Arnetha Courser, MD  olmesartan (BENICAR) 20 MG tablet Take 1 tablet (20 mg total) by mouth daily. 01/29/20   Fargo, Amy E, NP  pravastatin (PRAVACHOL) 80 MG tablet Take 1 tablet (80 mg total) by mouth every evening. 01/29/20   Fargo, Amy E, NP  Travoprost, BAK  Free, (TRAVATAN) 0.004 % SOLN ophthalmic solution Place 1 drop into both eyes at bedtime. 01/29/20   Octavia HeirFargo, Amy E, NP    Physical Exam: Vitals:   01/31/2020 1845 02/14/2020 1930 03/01/2020 2030 02/26/2020 2221  BP:  (!) 90/53 97/61 (!) 149/80  Pulse:  (!) 55 69 77  Resp:  (!) 22 (!) 22 18  Temp:   97.8 F (36.6 C) 97.9 F (36.6 C)  TempSrc:   Axillary   SpO2: 93% 94% 92% 100%    Constitutional: NAD, calm, comfortable Eyes: PERRL, lids and conjunctivae normal ENMT: Mucous membranes are moist. Posterior pharynx clear of any exudate or lesions.Normal dentition.  Neck: normal, supple, no masses, no thyromegaly Respiratory: clear to auscultation bilaterally, no  wheezing, no crackles. Normal respiratory effort. No accessory muscle use.  Cardiovascular: irr, irr Abdomen: no tenderness, no masses palpated. No hepatosplenomegaly. Bowel sounds positive.  Musculoskeletal: L hip TTP. Skin: no rashes, lesions, ulcers. No induration Neurologic: CN 2-12 grossly intact. Sensation intact, DTR normal. Strength 5/5 in all 4.  Psychiatric: Normal judgment and insight. Alert and oriented x 3. Normal mood.    Labs on Admission: I have personally reviewed following labs and imaging studies  CBC: Recent Labs  Lab 02/10/2020 1338  WBC 14.4*  NEUTROABS 8.7*  HGB 8.8*  HCT 28.2*  MCV 109.7*  PLT 173   Basic Metabolic Panel: Recent Labs  Lab 02/02/2020 1338  NA 137  K 4.6  CL 105  CO2 23  GLUCOSE 166*  BUN 63*  CREATININE 1.93*  CALCIUM 8.3*   GFR: CrCl cannot be calculated (Unknown ideal weight.). Liver Function Tests: Recent Labs  Lab 02/14/2020 1338  AST 16  ALT 16  ALKPHOS 141*  BILITOT 0.3  PROT 5.4*  ALBUMIN 2.9*   No results for input(s): LIPASE, AMYLASE in the last 168 hours. No results for input(s): AMMONIA in the last 168 hours. Coagulation Profile: No results for input(s): INR, PROTIME in the last 168 hours. Cardiac Enzymes: No results for input(s): CKTOTAL, CKMB, CKMBINDEX, TROPONINI in the last 168 hours. BNP (last 3 results) No results for input(s): PROBNP in the last 8760 hours. HbA1C: No results for input(s): HGBA1C in the last 72 hours. CBG: No results for input(s): GLUCAP in the last 168 hours. Lipid Profile: No results for input(s): CHOL, HDL, LDLCALC, TRIG, CHOLHDL, LDLDIRECT in the last 72 hours. Thyroid Function Tests: No results for input(s): TSH, T4TOTAL, FREET4, T3FREE, THYROIDAB in the last 72 hours. Anemia Panel: No results for input(s): VITAMINB12, FOLATE, FERRITIN, TIBC, IRON, RETICCTPCT in the last 72 hours. Urine analysis: No results found for: COLORURINE, APPEARANCEUR, LABSPEC, PHURINE, GLUCOSEU, HGBUR,  BILIRUBINUR, KETONESUR, PROTEINUR, UROBILINOGEN, NITRITE, LEUKOCYTESUR  Radiological Exams on Admission: CT ABDOMEN PELVIS WO CONTRAST  Result Date: 02/05/2020 CLINICAL DATA:  Fall, left hip pain, evaluate for retroperitoneal hematoma EXAM: CT ABDOMEN AND PELVIS WITHOUT CONTRAST TECHNIQUE: Multidetector CT imaging of the abdomen and pelvis was performed following the standard protocol without IV contrast. COMPARISON:  Right hip radiographs dated 01/12/2020. CT abdomen/pelvis dated 08/11/2013. FINDINGS: Lower chest: Trace bilateral pleural effusions. Hepatobiliary: Unenhanced liver is unremarkable. Gallbladder is unremarkable. No intrahepatic or extrahepatic ductal dilatation. Pancreas: Within normal limits. Spleen: Within normal limits. Adrenals/Urinary Tract: Adrenal glands are within normal limits. Bilateral renal cysts, including a 7.9 cm exophytic right lower pole renal cyst (series 3/image 38). No hydronephrosis. Bladder is underdistended but unremarkable. Stomach/Bowel: Stomach is within normal limits. No evidence of bowel obstruction. Status post ileocecal resection with appendectomy. Sigmoid  diverticulosis, without evidence of diverticulitis. Vascular/Lymphatic: Status post aorta bi-iliac stent repair of and abdominal aortic aneurysm. Maximal AP diameter is 5.2 cm, previously 4.9 cm in 2015, grossly unchanged. Atherosclerotic calcifications of the abdominal aorta and branch vessels. No suspicious abdominopelvic lymphadenopathy Reproductive: Prostate is grossly unremarkable. Other: No abdominopelvic ascites. Musculoskeletal: Acute subcapital left hip fracture with mild foreshortening and impaction (coronal image 58). Subacute intertrochanteric right hip fracture, s/p ORIF, with mild fragmentation anteriorly involving the greater trochanter. Mild degenerative changes of the visualized thoracolumbar spine. IMPRESSION: No evidence of retroperitoneal hematoma. Acute subcapital left hip fracture, as above.  Subacute intertrochanteric right hip fracture status post ORIF, as above. Electronically Signed   By: Charline Bills M.D.   On: 02/14/2020 14:02   DG Chest 1 View  Result Date: Feb 24, 2020 CLINICAL DATA:  Fall. EXAM: CHEST  1 VIEW COMPARISON:  01/10/2020 FINDINGS: Stable mild cardiac enlargement. Status post prior CABG. There is no evidence of pulmonary edema, consolidation, pneumothorax, nodule or pleural fluid. Visualized bony structures are unremarkable. IMPRESSION: Stable mild cardiac enlargement. No acute findings. Electronically Signed   By: Irish Lack M.D.   On: 02/05/2020 13:51   CT Head Wo Contrast  Result Date: 02/20/2020 CLINICAL DATA:  Patient status post fall. EXAM: CT HEAD WITHOUT CONTRAST CT CERVICAL SPINE WITHOUT CONTRAST TECHNIQUE: Multidetector CT imaging of the head and cervical spine was performed following the standard protocol without intravenous contrast. Multiplanar CT image reconstructions of the cervical spine were also generated. COMPARISON:  Brain and C-spine CT 01/10/2020. FINDINGS: CT HEAD FINDINGS Brain: Ventricles and sulci are appropriate for patient's age. Periventricular and subcortical white matter hypodensity compatible with chronic microvascular ischemic changes. No evidence for acute cortically based infarct, intracranial hemorrhage, mass lesion or mass-effect. Vascular: Unremarkable Skull: Intact. Sinuses/Orbits: Paranasal sinuses well aerated. Mastoid air cells unremarkable. Orbits unremarkable. Other: None CT CERVICAL SPINE FINDINGS Alignment: Mild grade 1 anterolisthesis C4 on C5. Otherwise normal anatomic alignment. Skull base and vertebrae: Degenerative changes.  Intact. Soft tissues and spinal canal: No prevertebral fluid or swelling. No visible canal hematoma. Disc levels:  Degenerative disc disease most pronounced C5-6. Upper chest: Unremarkable. Other: None. IMPRESSION: 1. No acute intracranial process. Atrophy and chronic microvascular ischemic  changes. 2. No acute cervical spine fracture. Degenerative disc disease. Electronically Signed   By: Annia Belt M.D.   On: 02/12/2020 13:47   CT Cervical Spine Wo Contrast  Result Date: 02/26/2020 CLINICAL DATA:  Patient status post fall. EXAM: CT HEAD WITHOUT CONTRAST CT CERVICAL SPINE WITHOUT CONTRAST TECHNIQUE: Multidetector CT imaging of the head and cervical spine was performed following the standard protocol without intravenous contrast. Multiplanar CT image reconstructions of the cervical spine were also generated. COMPARISON:  Brain and C-spine CT 01/10/2020. FINDINGS: CT HEAD FINDINGS Brain: Ventricles and sulci are appropriate for patient's age. Periventricular and subcortical white matter hypodensity compatible with chronic microvascular ischemic changes. No evidence for acute cortically based infarct, intracranial hemorrhage, mass lesion or mass-effect. Vascular: Unremarkable Skull: Intact. Sinuses/Orbits: Paranasal sinuses well aerated. Mastoid air cells unremarkable. Orbits unremarkable. Other: None CT CERVICAL SPINE FINDINGS Alignment: Mild grade 1 anterolisthesis C4 on C5. Otherwise normal anatomic alignment. Skull base and vertebrae: Degenerative changes.  Intact. Soft tissues and spinal canal: No prevertebral fluid or swelling. No visible canal hematoma. Disc levels:  Degenerative disc disease most pronounced C5-6. Upper chest: Unremarkable. Other: None. IMPRESSION: 1. No acute intracranial process. Atrophy and chronic microvascular ischemic changes. 2. No acute cervical spine fracture. Degenerative disc disease. Electronically Signed  By: Annia Beltrew  Davis M.D.   On: December 01, 2020 13:47   DG Hip Unilat W or Wo Pelvis 2-3 Views Left  Result Date: October 13, 2020 CLINICAL DATA:  Status post fall. EXAM: DG HIP (WITH OR WITHOUT PELVIS) 2-3V LEFT COMPARISON:  None. FINDINGS: There is fracture of the left femoral neck. Patient status post prior fixation of proximal right femur. No other acute fracture or  dislocation is identified. IMPRESSION: Fracture of left femoral neck. Electronically Signed   By: Sherian ReinWei-Chen  Lin M.D.   On: December 01, 2020 13:48    EKG: Independently reviewed.  A.Fib.  Assessment/Plan Principal Problem:   Closed left hip fracture (HCC) Active Problems:   S/P right hip fracture   Essential hypertension   Chronic kidney disease, stage 3b (HCC)   Coronary artery disease involving coronary bypass graft of native heart without angina pectoris   New onset a-fib (HCC)    1. Closed L hip fx - 1. Hip fx pathway 2. NPO after MN 3. Got 1 dose of DVT ppx at 1600 in ED 4. Ortho consulted, possible repair tomorrow 5. Pain ctrl per pathway 2. New onset a.fib - 1. Asymptomatic 2. No full dose anticoagulation for the moment due to: 1. OR repair for hip fx 2. Pt seems very high fall risk (2 broken hips in 2 months now). 3. Tele monitor 4. Cont BB he already takes 3. CAD - 1. Mild trop elevation today but asymptomatic w/o CP 2. Will get serial trops throughout night. 3. Cont BB 4. HTN - 1. BP a bit soft in ED 2. Holding amlodipine and losartan 5. CKD 3b- 1. Daily labs 2. Hold losartan  DVT prophylaxis: SCDs Code Status: DNR - yellow form in chart Family Communication: No family in room Disposition Plan: SNF vs CIR after hip fx repair Consults called: EDP spoke with Dr. Shon BatonBrooks Admission status: Admit to inpatient  Severity of Illness: The appropriate patient status for this patient is INPATIENT. Inpatient status is judged to be reasonable and necessary in order to provide the required intensity of service to ensure the patient's safety. The patient's presenting symptoms, physical exam findings, and initial radiographic and laboratory data in the context of their chronic comorbidities is felt to place them at high risk for further clinical deterioration. Furthermore, it is not anticipated that the patient will be medically stable for discharge from the hospital within 2  midnights of admission. The following factors support the patient status of inpatient.   IP status due to hip fx, need for OR repair.   * I certify that at the point of admission it is my clinical judgment that the patient will require inpatient hospital care spanning beyond 2 midnights from the point of admission due to high intensity of service, high risk for further deterioration and high frequency of surveillance required.*    Jaidan Prevette M. DO [facility]  How to contact the [facility] Attending or Consulting provider 7A - 7P or covering provider during after hours 7P -7A, for this patient?  1. Check the care team in [facility] and look for a) attending/consulting [facility] provider listed and b) the [facility] team listed 2. Log into [email or web address]  Amion Physician Scheduling and messaging for groups and whole hospitals  On call and physician scheduling software for group practices, residents, hospitalists and other medical providers for call, clinic, rotation and shift schedules. On[employer] is a hospital-wide system for scheduling doctors and paging doctors on call. EasyPlot is for scientific plotting and data analysis.  [email or web address]  and use [facility]'s universal password to access. If you do not have the password, please contact the hospital operator.  3. Locate the [facility] provider you are looking for under [facility] and page to a number that you can be directly reached. 4. If you still have difficulty reaching the provider, please page the [facility] (Director on Call) for the Hospitalists listed on amion for assistance.  02/09/2020, 11:06 PM

## 2020-02-21 NOTE — ED Notes (Signed)
Went to get urine, PT was medicated and sleeping, will return later.

## 2020-02-21 NOTE — ED Notes (Signed)
Spoke to pt wife, updated on pt transfer status and gave pt room number and phone number for unit

## 2020-02-21 NOTE — Plan of Care (Signed)
87 year old with status post RIGHT hip fracture last month status post ORIF 01/12/2020 was discharged to rehab.  Patient came home for rehab and fell again and now has a new LEFT hip fracture.  Sounds like this was a mechanical fall however routine EKG reveals atrial fibrillation which apparently is new onset.  Patient does not have a history of previous A. fib.  Patient denies chest pain although initial troponin is 29 this is being repeated.

## 2020-02-22 ENCOUNTER — Encounter (HOSPITAL_COMMUNITY): Admission: EM | Disposition: E | Payer: Self-pay | Source: Home / Self Care | Attending: Family Medicine

## 2020-02-22 ENCOUNTER — Inpatient Hospital Stay (HOSPITAL_COMMUNITY): Payer: Medicare Other

## 2020-02-22 ENCOUNTER — Other Ambulatory Visit: Payer: Self-pay

## 2020-02-22 ENCOUNTER — Inpatient Hospital Stay (HOSPITAL_COMMUNITY): Payer: Medicare Other | Admitting: Certified Registered Nurse Anesthetist

## 2020-02-22 DIAGNOSIS — N1832 Chronic kidney disease, stage 3b: Secondary | ICD-10-CM | POA: Diagnosis not present

## 2020-02-22 DIAGNOSIS — I25810 Atherosclerosis of coronary artery bypass graft(s) without angina pectoris: Secondary | ICD-10-CM

## 2020-02-22 DIAGNOSIS — I255 Ischemic cardiomyopathy: Secondary | ICD-10-CM | POA: Diagnosis not present

## 2020-02-22 DIAGNOSIS — I4891 Unspecified atrial fibrillation: Secondary | ICD-10-CM | POA: Diagnosis not present

## 2020-02-22 DIAGNOSIS — Z01810 Encounter for preprocedural cardiovascular examination: Secondary | ICD-10-CM | POA: Diagnosis not present

## 2020-02-22 DIAGNOSIS — S72002A Fracture of unspecified part of neck of left femur, initial encounter for closed fracture: Secondary | ICD-10-CM

## 2020-02-22 DIAGNOSIS — I480 Paroxysmal atrial fibrillation: Secondary | ICD-10-CM | POA: Diagnosis not present

## 2020-02-22 DIAGNOSIS — R778 Other specified abnormalities of plasma proteins: Secondary | ICD-10-CM

## 2020-02-22 DIAGNOSIS — I10 Essential (primary) hypertension: Secondary | ICD-10-CM | POA: Diagnosis not present

## 2020-02-22 DIAGNOSIS — R7989 Other specified abnormal findings of blood chemistry: Secondary | ICD-10-CM | POA: Diagnosis not present

## 2020-02-22 LAB — BASIC METABOLIC PANEL
Anion gap: 6 (ref 5–15)
BUN: 65 mg/dL — ABNORMAL HIGH (ref 8–23)
CO2: 25 mmol/L (ref 22–32)
Calcium: 8.1 mg/dL — ABNORMAL LOW (ref 8.9–10.3)
Chloride: 108 mmol/L (ref 98–111)
Creatinine, Ser: 2.12 mg/dL — ABNORMAL HIGH (ref 0.61–1.24)
GFR, Estimated: 28 mL/min — ABNORMAL LOW (ref >60)
Glucose, Bld: 136 mg/dL — ABNORMAL HIGH (ref 70–99)
Potassium: 5.2 mmol/L — ABNORMAL HIGH (ref 3.5–5.1)
Sodium: 139 mmol/L (ref 135–145)

## 2020-02-22 LAB — CBC
HCT: 27.1 % — ABNORMAL LOW (ref 39.0–52.0)
Hemoglobin: 8.2 g/dL — ABNORMAL LOW (ref 13.0–17.0)
MCH: 33.6 pg (ref 26.0–34.0)
MCHC: 30.3 g/dL (ref 30.0–36.0)
MCV: 111.1 fL — ABNORMAL HIGH (ref 80.0–100.0)
Platelets: 143 10*3/uL — ABNORMAL LOW (ref 150–400)
RBC: 2.44 MIL/uL — ABNORMAL LOW (ref 4.22–5.81)
RDW: 16 % — ABNORMAL HIGH (ref 11.5–15.5)
WBC: 12.1 10*3/uL — ABNORMAL HIGH (ref 4.0–10.5)
nRBC: 0 % (ref 0.0–0.2)

## 2020-02-22 LAB — TROPONIN I (HIGH SENSITIVITY)
Troponin I (High Sensitivity): 127 ng/L (ref <18)
Troponin I (High Sensitivity): 172 ng/L (ref <18)
Troponin I (High Sensitivity): 294 ng/L (ref <18)
Troponin I (High Sensitivity): 266 ng/L (ref <18)

## 2020-02-22 LAB — ECHOCARDIOGRAM COMPLETE: S' Lateral: 3.5 cm

## 2020-02-22 LAB — PREPARE RBC (CROSSMATCH)

## 2020-02-22 LAB — APTT: aPTT: 39 seconds — ABNORMAL HIGH (ref 24–36)

## 2020-02-22 LAB — D-DIMER, QUANTITATIVE: D-Dimer, Quant: 4.81 ug/mL-FEU — ABNORMAL HIGH (ref 0.00–0.50)

## 2020-02-22 SURGERY — ARTHROPLASTY, HIP, TOTAL, ANTERIOR APPROACH
Anesthesia: Choice | Site: Hip | Laterality: Left

## 2020-02-22 MED ORDER — SODIUM CHLORIDE 0.9% IV SOLUTION: Freq: Once | INTRAVENOUS | Status: AC

## 2020-02-22 MED ORDER — PERFLUTREN LIPID MICROSPHERE
1.0000 mL | INTRAVENOUS | Status: AC | PRN
  Administered 2020-02-22: 3 mL via INTRAVENOUS
  Filled 2020-02-22: qty 10

## 2020-02-22 MED ORDER — SODIUM CHLORIDE 0.9 % IV SOLN: INTRAVENOUS | Status: DC

## 2020-02-22 MED ORDER — LACTATED RINGERS IV SOLN: INTRAVENOUS | Status: DC | PRN

## 2020-02-22 MED ORDER — PROPOFOL 10 MG/ML IV BOLUS
INTRAVENOUS | Status: AC
  Filled 2020-02-22: qty 20

## 2020-02-22 MED ORDER — FENTANYL CITRATE (PF) 100 MCG/2ML IJ SOLN
INTRAMUSCULAR | Status: AC
  Filled 2020-02-22: qty 2

## 2020-02-22 MED ORDER — ONDANSETRON HCL 4 MG/2ML IJ SOLN
INTRAMUSCULAR | Status: AC
  Filled 2020-02-22: qty 2

## 2020-02-22 MED ORDER — TRANEXAMIC ACID-NACL 1000-0.7 MG/100ML-% IV SOLN
INTRAVENOUS | Status: AC
  Filled 2020-02-22: qty 100

## 2020-02-22 MED ORDER — HEPARIN BOLUS VIA INFUSION: 2000.0000 [IU] | Freq: Once | INTRAVENOUS | Status: DC

## 2020-02-22 MED ORDER — LIDOCAINE HCL (PF) 2 % IJ SOLN
INTRAMUSCULAR | Status: AC
  Filled 2020-02-22: qty 5

## 2020-02-22 MED ORDER — CEFAZOLIN SODIUM-DEXTROSE 2-4 GM/100ML-% IV SOLN
INTRAVENOUS | Status: AC
  Filled 2020-02-22: qty 100

## 2020-02-22 MED ORDER — ENOXAPARIN SODIUM 40 MG/0.4ML ~~LOC~~ SOLN
40.0000 mg | Freq: Once | SUBCUTANEOUS | Status: AC
  Administered 2020-02-22: 40 mg via SUBCUTANEOUS
  Filled 2020-02-22: qty 0.4

## 2020-02-22 MED ORDER — HEPARIN (PORCINE) 25000 UT/250ML-% IV SOLN
1200.0000 [IU]/h | INTRAVENOUS | Status: DC
  Administered 2020-02-22: 1200 [IU]/h via INTRAVENOUS
  Filled 2020-02-22: qty 250

## 2020-02-22 MED ORDER — DEXAMETHASONE SODIUM PHOSPHATE 10 MG/ML IJ SOLN
INTRAMUSCULAR | Status: AC
  Filled 2020-02-22: qty 1

## 2020-02-22 NOTE — Progress Notes (Signed)
PROGRESS NOTE    Jacob [name]  SFK:[id number] DOB: [birth year] DOA: 02/01/2020 PCP: Malka So., MD   Brief Narrative: Jacob [name] is a 87 y.o. male with a history of CAD, CKD stage IIIb, hypertension, recent right hip fracture. Patient presented secondary to fall, suffering a left hip fracture. Plan for surgical repair complicated by concern for possible acute cardiac process.   Assessment & Plan:   Principal Problem:   Fracture of femoral neck, left (HCC) Active Problems:   S/P right hip fracture   Essential hypertension   Chronic kidney disease, stage 3b (HCC)   Coronary artery disease involving coronary bypass graft of native heart without angina pectoris   Closed left hip fracture (HCC)   New onset a-fib (HCC)   Left hip fracture Closed fracture. Patient recently in rehab for previous right hip fracture. Orthopedic surgery consulted on admission with plans for left hip repair which is now complicated by possible acute cardiac issue. Surgery on hold pending cardiac workup -Orthopedic surgery recommendations: possible surgery 1/24 pending cardiac workup/recommendations  New onset atrial fibrillation Associated elevated troponin.   Elevated troponin EKG non-specific but with ST-T changes. High risk history. Transthoracic Echocardiogram ordered and significant for low EF of 40-45% with severe hypokinesis of basal-to-mid anterior/septal LV walls. Also significant for moderately reduced RV function with moderately enlarged size. -Cardiology consulted  Right ventricle dysfunction Concern this may be related to acute PE. D-dimer obtained and is 4.81. Patient cannot tolerate CTA chest secondary to renal impairment. -V/Q scan -Heparin IV  CKD stage IIIb Baseline creatinine appears to be around 1.7-1.9. Slightly elevated possibly related to transient hypotension. -BMP in AM  Hyperkalemia Mild -Repeat BMP in AM  CAD History of CABG. Per outpatient notes, last  cardiac catheterization was in 2016 and showed patent grafts. Patient is on aspirin, Coreg, olmesartan as an outpatient  Chronic anemia No long term records available but prior to previous surgery, hemoglobin was 12.8. Hemoglobin currently stable from previous discharge post surgery. Orthopedic surgery recommending blood transfusion. -Will transfuse 1 unit of PRBC  Hyperlipidemia -Continue pravastatin  Primary hypertension Patient is on amlodipine, olmesartan, Coreg as an outpatient . Patient presented hypotensive; antihypertensives held on admission.   DVT prophylaxis: Heparin IV Code Status:   Code Status: DNR Family Communication: None at bedside. Called wife with no response; voice mail left Disposition Plan: Discharge home vs SNF in several days pending cardiac workup, possible PE workup, orthopedic surgery management for hip fracture   Consultants:   Orthopedic surgery  Cardiology  Procedures:   None  Antimicrobials:  None    Subjective: Wants water. No chest pain or dyspnea.  Objective: Vitals:   03/01/2020 0634 02/18/2020 0643 02/05/2020 0644 02/07/2020 0645  BP: 132/67  (!) 135/91 (!) 154/67  Pulse:  63    Resp: 15 15 14 16   Temp:      TempSrc:      SpO2: 91% (!) 84%     No intake or output data in the 24 hours ending 02/02/2020 0902 There were no vitals filed for this visit.  Examination:  General exam: No distress Respiratory system: Clear to auscultation. Respiratory effort normal. Cardiovascular system: S1 & S2 heard, RRR. No murmurs, rubs, gallops or clicks. Gastrointestinal system: Abdomen is nondistended, soft and nontender. No organomegaly or masses felt. Normal bowel sounds heard. Central nervous system: Alert. Would not answer orientation questions. No focal neurological deficits. Musculoskeletal: 2-3+ BLE pitting edema up to thighs. No calf tenderness Skin: No cyanosis.  No rashes Psychiatry: Judgement and insight appear normal. Mildly  agitated    Data Reviewed: I have personally reviewed following labs and imaging studies  CBC Lab Results  Component Value Date   WBC 12.1 (H) 02/20/2020   RBC 2.44 (L) 02/28/2020   HGB 8.2 (L) 02/10/2020   HCT 27.1 (L) 02/14/2020   MCV 111.1 (H) 02/08/2020   MCH 33.6 02/14/2020   PLT 143 (L) 02/15/2020   MCHC 30.3 02/26/2020   RDW 16.0 (H) 02/12/2020   LYMPHSABS 4.5 (H) 03-03-2020   MONOABS 1.1 (H) 03-Mar-2020   EOSABS 0.0 03/03/20   BASOSABS 0.0 2020-03-03     Last metabolic panel Lab Results  Component Value Date   NA 139 02/18/2020   K 5.2 (H) 02/10/2020   CL 108 02/17/2020   CO2 25 01/31/2020   BUN 65 (H) 02/08/2020   CREATININE 2.12 (H) 02/20/2020   GLUCOSE 136 (H) 02/25/2020   GFRNONAA 28 (L) 02/16/2020   CALCIUM 8.1 (L) 02/25/2020   PROT 5.4 (L) 03/03/2020   ALBUMIN 2.9 (L) 2020-03-03   BILITOT 0.3 Mar 03, 2020   ALKPHOS 141 (H) 2020-03-03   AST 16 Mar 03, 2020   ALT 16 03-03-20   ANIONGAP 6 02/20/2020    CBG (last 3)  No results for input(s): GLUCAP in the last 72 hours.   GFR: CrCl cannot be calculated (Unknown ideal weight.).  Coagulation Profile: No results for input(s): INR, PROTIME in the last 168 hours.  Recent Results (from the past 240 hour(s))  SARS Coronavirus 2 by RT PCR (hospital order, performed in [facility] hospital lab) Nasopharyngeal Nasopharyngeal Swab     Status: None   Collection Time: 03-03-2020  2:28 PM   Specimen: Nasopharyngeal Swab  Result Value Ref Range Status   SARS Coronavirus 2 NEGATIVE NEGATIVE Final    Comment: (NOTE) SARS-CoV-2 target nucleic acids are NOT DETECTED.  The SARS-CoV-2 RNA is generally detectable in upper and lower respiratory specimens during the acute phase of infection. The lowest concentration of SARS-CoV-2 viral copies this assay can detect is 250 copies / mL. A negative result does not preclude SARS-CoV-2 infection and should not be used as the sole basis for treatment or other patient  management decisions.  A negative result may occur with improper specimen collection / handling, submission of specimen other than nasopharyngeal swab, presence of viral mutation(s) within the areas targeted by this assay, and inadequate number of viral copies (<250 copies / mL). A negative result must be combined with clinical observations, patient history, and epidemiological information.  Fact Sheet for Patients:   BoilerBrush.com.cy  Fact Sheet for Healthcare Providers: https://pope.com/  This test is not yet approved or  cleared by the [place] FDA and has been authorized for detection and/or diagnosis of SARS-CoV-2 by FDA under an Emergency Use Authorization (EUA).  This EUA will remain in effect (meaning this test can be used) for the duration of the COVID-19 declaration under Section 564(b)(1) of the Act, 21 U.S.C. section 360bbb-3(b)(1), unless the authorization is terminated or revoked sooner.  Performed at [facility], [address] [place], [place] [address]         Radiology Studies: CT ABDOMEN PELVIS WO CONTRAST  Result Date: 03-03-20 CLINICAL DATA:  Fall, left hip pain, evaluate for retroperitoneal hematoma EXAM: CT ABDOMEN AND PELVIS WITHOUT CONTRAST TECHNIQUE: Multidetector CT imaging of the abdomen and pelvis was performed following the standard protocol without IV contrast. COMPARISON:  Right hip radiographs dated 01/12/2020. CT abdomen/pelvis  dated 08/11/2013. FINDINGS: Lower chest: Trace bilateral pleural effusions. Hepatobiliary: Unenhanced liver is unremarkable. Gallbladder is unremarkable. No intrahepatic or extrahepatic ductal dilatation. Pancreas: Within normal limits. Spleen: Within normal limits. Adrenals/Urinary Tract: Adrenal glands are within normal limits. Bilateral renal cysts, including a 7.9 cm exophytic right lower pole renal cyst (series 3/image 38). No hydronephrosis. Bladder is  underdistended but unremarkable. Stomach/Bowel: Stomach is within normal limits. No evidence of bowel obstruction. Status post ileocecal resection with appendectomy. Sigmoid diverticulosis, without evidence of diverticulitis. Vascular/Lymphatic: Status post aorta bi-iliac stent repair of and abdominal aortic aneurysm. Maximal AP diameter is 5.2 cm, previously 4.9 cm in 2015, grossly unchanged. Atherosclerotic calcifications of the abdominal aorta and branch vessels. No suspicious abdominopelvic lymphadenopathy Reproductive: Prostate is grossly unremarkable. Other: No abdominopelvic ascites. Musculoskeletal: Acute subcapital left hip fracture with mild foreshortening and impaction (coronal image 58). Subacute intertrochanteric right hip fracture, s/p ORIF, with mild fragmentation anteriorly involving the greater trochanter. Mild degenerative changes of the visualized thoracolumbar spine. IMPRESSION: No evidence of retroperitoneal hematoma. Acute subcapital left hip fracture, as above. Subacute intertrochanteric right hip fracture status post ORIF, as above. Electronically Signed   By: Charline Bills M.D.   On: 02/10/2020 14:02   DG Chest 1 View  Result Date: 01/31/2020 CLINICAL DATA:  Fall. EXAM: CHEST  1 VIEW COMPARISON:  01/10/2020 FINDINGS: Stable mild cardiac enlargement. Status post prior CABG. There is no evidence of pulmonary edema, consolidation, pneumothorax, nodule or pleural fluid. Visualized bony structures are unremarkable. IMPRESSION: Stable mild cardiac enlargement. No acute findings. Electronically Signed   By: Irish Lack M.D.   On: 02/20/2020 13:51   CT Head Wo Contrast  Result Date: 02/02/2020 CLINICAL DATA:  Patient status post fall. EXAM: CT HEAD WITHOUT CONTRAST CT CERVICAL SPINE WITHOUT CONTRAST TECHNIQUE: Multidetector CT imaging of the head and cervical spine was performed following the standard protocol without intravenous contrast. Multiplanar CT image reconstructions of the  cervical spine were also generated. COMPARISON:  Brain and C-spine CT 01/10/2020. FINDINGS: CT HEAD FINDINGS Brain: Ventricles and sulci are appropriate for patient's age. Periventricular and subcortical white matter hypodensity compatible with chronic microvascular ischemic changes. No evidence for acute cortically based infarct, intracranial hemorrhage, mass lesion or mass-effect. Vascular: Unremarkable Skull: Intact. Sinuses/Orbits: Paranasal sinuses well aerated. Mastoid air cells unremarkable. Orbits unremarkable. Other: None CT CERVICAL SPINE FINDINGS Alignment: Mild grade 1 anterolisthesis C4 on C5. Otherwise normal anatomic alignment. Skull base and vertebrae: Degenerative changes.  Intact. Soft tissues and spinal canal: No prevertebral fluid or swelling. No visible canal hematoma. Disc levels:  Degenerative disc disease most pronounced C5-6. Upper chest: Unremarkable. Other: None. IMPRESSION: 1. No acute intracranial process. Atrophy and chronic microvascular ischemic changes. 2. No acute cervical spine fracture. Degenerative disc disease. Electronically Signed   By: Annia Belt M.D.   On: 02/09/2020 13:47   CT Cervical Spine Wo Contrast  Result Date: 02/03/2020 CLINICAL DATA:  Patient status post fall. EXAM: CT HEAD WITHOUT CONTRAST CT CERVICAL SPINE WITHOUT CONTRAST TECHNIQUE: Multidetector CT imaging of the head and cervical spine was performed following the standard protocol without intravenous contrast. Multiplanar CT image reconstructions of the cervical spine were also generated. COMPARISON:  Brain and C-spine CT 01/10/2020. FINDINGS: CT HEAD FINDINGS Brain: Ventricles and sulci are appropriate for patient's age. Periventricular and subcortical white matter hypodensity compatible with chronic microvascular ischemic changes. No evidence for acute cortically based infarct, intracranial hemorrhage, mass lesion or mass-effect. Vascular: Unremarkable Skull: Intact. Sinuses/Orbits: Paranasal sinuses  well aerated. Mastoid air  cells unremarkable. Orbits unremarkable. Other: None CT CERVICAL SPINE FINDINGS Alignment: Mild grade 1 anterolisthesis C4 on C5. Otherwise normal anatomic alignment. Skull base and vertebrae: Degenerative changes.  Intact. Soft tissues and spinal canal: No prevertebral fluid or swelling. No visible canal hematoma. Disc levels:  Degenerative disc disease most pronounced C5-6. Upper chest: Unremarkable. Other: None. IMPRESSION: 1. No acute intracranial process. Atrophy and chronic microvascular ischemic changes. 2. No acute cervical spine fracture. Degenerative disc disease. Electronically Signed   By: Annia Beltrew  Davis M.D.   On: 01/31/2020 13:47   DG Hip Unilat W or Wo Pelvis 2-3 Views Left  Result Date: 02/26/2020 CLINICAL DATA:  Status post fall. EXAM: DG HIP (WITH OR WITHOUT PELVIS) 2-3V LEFT COMPARISON:  None. FINDINGS: There is fracture of the left femoral neck. Patient status post prior fixation of proximal right femur. No other acute fracture or dislocation is identified. IMPRESSION: Fracture of left femoral neck. Electronically Signed   By: Sherian ReinWei-Chen  Lin M.D.   On: 02/25/2020 13:48        Scheduled Meds:  acetaminophen  1,000 mg Oral BID   carvedilol  6.25 mg Oral BID WC   docusate sodium  100 mg Oral BID   enoxaparin (LOVENOX) injection  40 mg Subcutaneous Once   folic acid  1 mg Oral Daily   latanoprost  1 drop Both Eyes QHS   multivitamin with minerals  1 tablet Oral Daily   pravastatin  80 mg Oral QPM   Continuous Infusions:  sodium chloride       LOS: 1 day     Jacquelin Hawkingalph Edla Para, MD [facility] 02/20/20, 9:02 AM  If 7PM-7AM, please contact night-coverage [email or web address]

## 2020-02-22 NOTE — Progress Notes (Signed)
ANTICOAGULATION CONSULT NOTE - Initial Consult  Pharmacy Consult for heparin infusion Indication: pulmonary embolus  No Known Allergies  Patient Measurements: Weight: 70.3 kg  Vital Signs: Temp: 97.5 F (36.4 C) (01/23 0921) Temp Source: Oral (01/23 0921) BP: 101/73 (01/23 1211) Pulse Rate: 82 (01/23 1211)  Labs: Recent Labs    02/09/2020 1338 02/19/2020 1631 02/15/2020 0011 02/21/2020 0124 03/01/2020 0852 02/23/2020 1258  HGB 8.8*  --  8.2*  --   --   --   HCT 28.2*  --  27.1*  --   --   --   PLT 173  --  143*  --   --   --   CREATININE 1.93*  --  2.12*  --   --   --   TROPONINIHS 29*   < >  --  172* 294* 266*   < > = values in this interval not displayed.    Estimated Creatinine Clearance: 21.8 mL/min (A) (by C-G formula based on SCr of 2.12 mg/dL (H)).   Medical History: Past Medical History:  Diagnosis Date  . Chronic kidney disease, stage 3b (HCC) 01/21/2020  . Coronary artery disease   . Hypertension     Medications:  No PTA anticoagulation  Assessment: Pharmacy consulted to dose heparin infusion for possible PE with elevated d-dimer and RV dysfunction.  Patient received enoxaparin 40 mg subq once on 02/26/2020 1643 and once on 02/20/2020 0941.  Goal of Therapy:  Heparin level 0.3-0.7 units/ml Monitor platelets by anticoagulation protocol: Yes   Plan:  No heparin bolus Start heparin infusion at 1200 units/hr Check anti-Xa level in 8 hours and daily while on heparin Continue to monitor H&H and platelets  Quenna Doepke P. Casimiro Needle, PharmD, BCPS Clinical Pharmacist [place] Please utilize Amion for appropriate phone number to reach the unit pharmacist [facility]) 02/07/2020 2:57 PM

## 2020-02-22 NOTE — Progress Notes (Signed)
Critical Lab Troponin 172 Patient asymptomatic Provider Has been made aware

## 2020-02-22 NOTE — Consult Note (Addendum)
Patient ID: Jacob [name] MRN: [id number] DOB/AGE: 85-Feb-1926 85 y.o.  Admit date: 02/20/2020  Chief Complaint:  left hip pain.  Subjective: Jacob [name], 87 y/o male, PMH significant for CAD, HTN, and CKD III, presented to [facility] Emergency Department on 02/12/2020 following a fall at home. Patient recently underwent an IM nailing with Dr .Charlann Boxer in December following a right hip fracture. He had returned home from [facility], wife reports he had a mechanical fall resulting in immediate severe left hip pain and inability to bear weight. He was brought to the ED, where workup showed a displaced left femoral neck fracture. Orthopedics was consulted for management of the fracture.   Allergies: No Known Allergies   Medications: Medications Prior to Admission  Medication Sig Dispense Refill Last Dose  . acetaminophen (TYLENOL) 500 MG tablet Take 1,000 mg by mouth every 8 (eight) hours as needed for mild pain.   Past Week at Unknown time  . Amino Acids-Protein Hydrolys (FEEDING SUPPLEMENT, PRO-STAT 64,) LIQD Take 30 mLs by mouth 2 (two) times daily between meals. 1800 mL 0 02/10/2020 at Unknown time  . aspirin EC 81 MG tablet Take 81 mg by mouth daily. Swallow whole.   02/18/2020 at Unknown time  . carvedilol (COREG) 6.25 MG tablet Take 1 tablet (6.25 mg total) by mouth 2 (two) times daily with a meal. 60 tablet 0 02/01/2020 at 1700  . Cholecalciferol (VITAMIN D) 125 MCG (5000 UT) CAPS Take 5,000 Units by mouth daily.   03/01/2020 at Unknown time  . loperamide (IMODIUM) 2 MG capsule Take 2 mg by mouth every other day.   02/20/2020  . pravastatin (PRAVACHOL) 80 MG tablet Take 1 tablet (80 mg total) by mouth every evening. 30 tablet 0 02/20/2020  . Travoprost, BAK Free, (TRAVATAN) 0.004 % SOLN ophthalmic solution Place 1 drop into both eyes at bedtime. 2 mL 0 Past Week at Unknown time  . vitamin B-12 (CYANOCOBALAMIN) 1000 MCG tablet Take 1,000 mcg by mouth daily.   02/11/2020 at Unknown  time  . alum & mag hydroxide-simeth (MAALOX/MYLANTA) 200-200-20 MG/5ML suspension Take 30 mLs by mouth every 4 (four) hours as needed for indigestion. (Patient not taking: Reported on 02/05/2020) 355 mL 0 Not Taking at Unknown time  . amLODipine (NORVASC) 5 MG tablet Take 1 tablet (5 mg total) by mouth daily. (Patient not taking: Reported on 02/04/2020) 30 tablet 0 Not Taking at Unknown time  . docusate sodium (COLACE) 100 MG capsule Take 1 capsule (100 mg total) by mouth 2 (two) times daily. (Patient not taking: Reported on 02/28/2020) 10 capsule 0 Not Taking at Unknown time  . feeding supplement (ENSURE ENLIVE / ENSURE PLUS) LIQD Take 237 mLs by mouth 2 (two) times daily between meals. (Patient not taking: Reported on 02/20/2020) 237 mL 12 Not Taking at Unknown time  . folic acid (FOLVITE) 1 MG tablet Take 1 tablet (1 mg total) by mouth daily. (Patient not taking: Reported on 02/10/2020)   Not Taking at Unknown time  . Multiple Vitamin (MULTIVITAMIN WITH MINERALS) TABS tablet Take 1 tablet by mouth daily. (Patient not taking: Reported on 02/05/2020)   Not Taking at Unknown time  . olmesartan (BENICAR) 20 MG tablet Take 1 tablet (20 mg total) by mouth daily. (Patient not taking: Reported on 02/06/2020) 30 tablet 0 Not Taking at Unknown time    Past Medical History: Past Medical History:  Diagnosis Date  . Chronic kidney disease, stage 3b (HCC) 01/21/2020  . Coronary artery disease   .  Hypertension      Past Surgical History: Past Surgical History:  Procedure Laterality Date  . ABDOMINAL AORTIC ANEURYSM REPAIR    . COLECTOMY    . CORONARY ARTERY BYPASS GRAFT    . FEMUR IM NAIL Right 01/12/2020   Procedure: INTRAMEDULLARY (IM) NAIL FEMORAL;  Surgeon: Durene Romans, MD;  Location: [facility] ORS;  Service: Orthopedics;  Laterality: Right;  . HERNIA REPAIR       Family History: No family history on file.  Social History: Social History   Tobacco Use  . Smoking status: Former Games developer  . Smokeless  tobacco: Never Used  . Tobacco comment: quit 50years ago  Substance Use Topics  . Alcohol use: Yes    Comment: socially     Review of Systems Constitutional: negative Eyes: negative Ears, nose, mouth, throat, and face: negative Respiratory: negative Cardiovascular: negative Gastrointestinal: negative  Physical Exam:  General: alert HENT:Head: Normocephalic, no lesions, without obvious abnormality. Neck:supple Abdomen:abdomen soft and non-tender Rectal/Breast/Genitalia: Not done, not pertinent to present illness. Musculoskeletal:  Left lower extremity shortened and externally rotated. Able to wiggle toes, sensation intact 2+ pulses. Dorsiflexion/plantar flexion intact. No open lesions about the hip.   LABS: Results for orders placed or performed during the hospital encounter of 03-01-20  SARS Coronavirus 2 by RT PCR (hospital order, performed in [facility] hospital lab) Nasopharyngeal Nasopharyngeal Swab   Specimen: Nasopharyngeal Swab  Result Value Ref Range   SARS Coronavirus 2 NEGATIVE NEGATIVE  CBC with Differential  Result Value Ref Range   WBC 14.4 (H) 4.0 - 10.5 K/uL   RBC 2.57 (L) 4.22 - 5.81 MIL/uL   Hemoglobin 8.8 (L) 13.0 - 17.0 g/dL   HCT 16.1 (L) 09.6 - 04.5 %   MCV 109.7 (H) 80.0 - 100.0 fL   MCH 34.2 (H) 26.0 - 34.0 pg   MCHC 31.2 30.0 - 36.0 g/dL   RDW 40.9 (H) 81.1 - 91.4 %   Platelets 173 150 - 400 K/uL   nRBC 0.0 0.0 - 0.2 %   Neutrophils Relative % 61 %   Neutro Abs 8.7 (H) 1.7 - 7.7 K/uL   Lymphocytes Relative 31 %   Lymphs Abs 4.5 (H) 0.7 - 4.0 K/uL   Monocytes Relative 7 %   Monocytes Absolute 1.1 (H) 0.1 - 1.0 K/uL   Eosinophils Relative 0 %   Eosinophils Absolute 0.0 0.0 - 0.5 K/uL   Basophils Relative 0 %   Basophils Absolute 0.0 0.0 - 0.1 K/uL   Immature Granulocytes 1 %   Abs Immature Granulocytes 0.07 0.00 - 0.07 K/uL  Comprehensive metabolic panel  Result Value Ref Range   Sodium 137 135 - 145 mmol/L   Potassium 4.6 3.5 -  5.1 mmol/L   Chloride 105 98 - 111 mmol/L   CO2 23 22 - 32 mmol/L   Glucose, Bld 166 (H) 70 - 99 mg/dL   BUN 63 (H) 8 - 23 mg/dL   Creatinine, Ser 7.82 (H) 0.61 - 1.24 mg/dL   Calcium 8.3 (L) 8.9 - 10.3 mg/dL   Total Protein 5.4 (L) 6.5 - 8.1 g/dL   Albumin 2.9 (L) 3.5 - 5.0 g/dL   AST 16 15 - 41 U/L   ALT 16 0 - 44 U/L   Alkaline Phosphatase 141 (H) 38 - 126 U/L   Total Bilirubin 0.3 0.3 - 1.2 mg/dL   GFR, Estimated 32 (L) >60 mL/min   Anion gap 9 5 - 15  CBC  Result Value Ref Range  WBC 12.1 (H) 4.0 - 10.5 K/uL   RBC 2.44 (L) 4.22 - 5.81 MIL/uL   Hemoglobin 8.2 (L) 13.0 - 17.0 g/dL   HCT 38.3 (L) 29.1 - 91.6 %   MCV 111.1 (H) 80.0 - 100.0 fL   MCH 33.6 26.0 - 34.0 pg   MCHC 30.3 30.0 - 36.0 g/dL   RDW 60.6 (H) 00.4 - 59.9 %   Platelets 143 (L) 150 - 400 K/uL   nRBC 0.0 0.0 - 0.2 %  Basic metabolic panel  Result Value Ref Range   Sodium 139 135 - 145 mmol/L   Potassium 5.2 (H) 3.5 - 5.1 mmol/L   Chloride 108 98 - 111 mmol/L   CO2 25 22 - 32 mmol/L   Glucose, Bld 136 (H) 70 - 99 mg/dL   BUN 65 (H) 8 - 23 mg/dL   Creatinine, Ser 7.74 (H) 0.61 - 1.24 mg/dL   Calcium 8.1 (L) 8.9 - 10.3 mg/dL   GFR, Estimated 28 (L) >60 mL/min   Anion gap 6 5 - 15  Type and screen [facility]  Result Value Ref Range   ABO/RH(D) A POS    Antibody Screen NEG    Sample Expiration      02/25/2020,2359 Performed at [facility], [address] [place], [place] [address]   Troponin I (High Sensitivity)  Result Value Ref Range   Troponin I (High Sensitivity) 29 (H) <18 ng/L  Troponin I (High Sensitivity)  Result Value Ref Range   Troponin I (High Sensitivity) 38 (H) <18 ng/L  Troponin I (High Sensitivity)  Result Value Ref Range   Troponin I (High Sensitivity) 127 (HH) <18 ng/L  Troponin I (High Sensitivity)  Result Value Ref Range   Troponin I (High Sensitivity) 172 (HH) <18 ng/L   Recent Labs    02/20/2020 1338 03/01/2020 0011  HGB 8.8* 8.2*    Recent Labs    02/08/2020 1338 02/10/2020 0011  WBC 14.4* 12.1*  RBC 2.57* 2.44*  HCT 28.2* 27.1*  PLT 173 143*   Recent Labs    02/01/2020 1338 02/20/2020 0011  NA 137 139  K 4.6 5.2*  CL 105 108  CO2 23 25  BUN 63* 65*  CREATININE 1.93* 2.12*  GLUCOSE 166* 136*  CALCIUM 8.3* 8.1*    Assessment/Plan: Displaced left femoral neck fracture  Fracture will require surgical fixation via total hip arthroplasty (anterior approach) due to displacement of the neck. He was originally scheduled to undergo surgery this AM with Ollen Gross, MD at [facility]. Unfortunately, he has new onset atrial fibrillation and his serial troponins are increasing. Dr. Lequita Halt spoke with anesthesiologist this AM, and felt it was in the patients best interest to hold off on surgical intervention until we can get him cleared by cardiology. Dr. Lequita Halt has spoken with hospitalist seeing Mr. Luhmann today (Dr. Caleb Popp), who is aware of the plan. If able, we will be able to operate tomorrow afternoon following Dr. Deri Fuelling regular surgical schedule if cleared.   NPO at midnight (cardiac diet today) Consent in chart One dose lovenox today  Arther Abbott, PA-C Orthopedic Surgery [facility]

## 2020-02-22 NOTE — Evaluation (Signed)
Clinical/Bedside Swallow Evaluation Patient Details  Name: Jacob [name] MRN: [id number] Date of Birth: Sep 14, 1924  Today's Date: Mar 13, 2020 Time: SLP Start Time (ACUTE ONLY): 1440 SLP Stop Time (ACUTE ONLY): 1500 SLP Time Calculation (min) (ACUTE ONLY): 20 min  Past Medical History:  Past Medical History:  Diagnosis Date  . Chronic kidney disease, stage 3b (HCC) 01/21/2020  . Coronary artery disease   . Hypertension    Past Surgical History:  Past Surgical History:  Procedure Laterality Date  . ABDOMINAL AORTIC ANEURYSM REPAIR    . COLECTOMY    . CORONARY ARTERY BYPASS GRAFT    . FEMUR IM NAIL Right 01/12/2020   Procedure: INTRAMEDULLARY (IM) NAIL FEMORAL;  Surgeon: Durene Romans, MD;  Location: [facility] ORS;  Service: Orthopedics;  Laterality: Right;  . HERNIA REPAIR     HPI:  Jacob [name], 87 y/o male, PMH significant for CAD, HTN, and CKD III, presented to [facility] Emergency Department on 02/18/2020 following a fall at home. Patient recently underwent an IM nailing with Dr .Charlann Boxer in December following a right hip fracture. He had returned home from [facility], wife reports he had a mechanical fall resulting in immediate severe left hip pain and inability to bear weight. He was brought to the ED, where workup showed a displaced left femoral neck fracture.   Assessment / Plan / Recommendation Clinical Impression  Patient presents with signs of pharyngeal and/or esophageal phase dysphagia characterized by multiple rapid swallows per bites/sip, intermittent wet vocal quality, throat clearing, and coughing (primarily with large bolus size thin liquid and regular texture solids). Patient and wife report dysphagia with pills characterized by globus for "some time" but report dysphagia characterized by today's presentation more recently. No reports of PNA or respiratory infections. Recommend diet downgrade to puree with thin liquid with aspiration precautions, MBS. If patient gets  cleared by cardiology to have surgery tomorrow, will plan for [facility] 1/25. If not, will complete MBS 1/24 as able. Wife and patient verbalize understanding. SLP Visit Diagnosis: Dysphagia, unspecified (R13.10)    Aspiration Risk  Moderate aspiration risk    Diet Recommendation Dysphagia 1 (Puree);Thin liquid   Liquid Administration via: Cup;Straw Medication Administration: Crushed with puree Supervision: Patient able to self feed;Full supervision/cueing for compensatory strategies Compensations: Slow rate;Small sips/bites;Follow solids with liquid Postural Changes: Seated upright at 90 degrees;Remain upright for at least 30 minutes after po intake    Other  Recommendations Oral Care Recommendations: Oral care BID   Follow up Recommendations  (TBD)        Swallow Study   General HPI: Jacob [name], 87 y/o male, PMH significant for CAD, HTN, and CKD III, presented to [facility] Emergency Department on 02/06/2020 following a fall at home. Patient recently underwent an IM nailing with Dr .Charlann Boxer in December following a right hip fracture. He had returned home from [facility], wife reports he had a mechanical fall resulting in immediate severe left hip pain and inability to bear weight. He was brought to the ED, where workup showed a displaced left femoral neck fracture. Type of Study: Bedside Swallow Evaluation Previous Swallow Assessment: none noted however globus with pills reported in 2021. Diet Prior to this Study: Regular;Thin liquids Temperature Spikes Noted: No Respiratory Status: Room air History of Recent Intubation: No Behavior/Cognition: Alert;Cooperative;Pleasant mood (HOH) Oral Cavity Assessment: Within Functional Limits Oral Care Completed by SLP: No Oral Cavity - Dentition: Adequate natural dentition Vision: Functional for self-feeding Self-Feeding Abilities: Able to feed self Patient Positioning: Upright in  bed Baseline Vocal Quality: Wet (although mid meal) Volitional  Cough: Strong Volitional Swallow: Able to elicit    Oral/Motor/Sensory Function Overall Oral Motor/Sensory Function: Within functional limits   Ice Chips Ice chips: Not tested   Thin Liquid Thin Liquid: Impaired Presentation: Cup;Self Fed;Straw Pharyngeal  Phase Impairments: Multiple swallows;Wet Vocal Quality;Throat Clearing - Delayed;Cough - Delayed    Nectar Thick Nectar Thick Liquid: Not tested   Honey Thick Honey Thick Liquid: Not tested   Puree Puree: Impaired Presentation: Spoon Pharyngeal Phase Impairments: Multiple swallows;Throat Clearing - Delayed   Solid     Solid: Impaired Presentation: Spoon Pharyngeal Phase Impairments: Multiple swallows;Wet Vocal Quality;Throat Clearing - Immediate;Throat Clearing - Delayed;Cough - Immediate;Cough - Delayed     Ferdinand Lango MA, CCC-SLP   [name] Meryl 02/27/2020,3:06 PM

## 2020-02-22 NOTE — Anesthesia Preprocedure Evaluation (Deleted)
Anesthesia Evaluation    Airway        Dental   Pulmonary former smoker,           Cardiovascular hypertension,      Neuro/Psych    GI/Hepatic   Endo/Other    Renal/GU      Musculoskeletal   Abdominal   Peds  Hematology   Anesthesia Other Findings   Reproductive/Obstetrics                             Anesthesia Physical Anesthesia Plan  ASA:   Anesthesia Plan:    Post-op Pain Management:    Induction:   PONV Risk Score and Plan:   Airway Management Planned:   Additional Equipment:   Intra-op Plan:   Post-operative Plan:   Informed Consent:   Plan Discussed with:   Anesthesia Plan Comments: (Case cancelled by Dr. Lequita Halt due to new onset a-fib and elevated troponins and a HgB of 8.2. Needs medical optimization.)        Anesthesia Quick Evaluation

## 2020-02-22 NOTE — Consult Note (Signed)
WOC Nurse Consult Note: Reason for Consult: Deep tissue pressure injury to left heel, Stage 2 pressure injury to sacrum. Daughter cares for these lesions at home and uses calcium alginate to sacrum and Medihoney to left heel.  We do not carry Medihoney; I have substituted xeroform (abntimicrobial, nonadherent) beneath a silicone foam. Wound type:Pressure Pressure Injury POA: Yes Measurement: Sacrum (Stage 2) 2cm x 1cm x 0.1cm Left heel DTPI: 4cm x 4cm with no depth (purple discoloration) Wound bed:As described above Drainage (amount, consistency, odor): scant from Stage 2 Periwound:intact, dry Dressing procedure/placement/frequency: I will provide orders for the care of these two wounds; I am not able to comply with the patient's home protocol using Medihoney as we do not carry in house. An alternate antimicrobial nonadherent is selected. Family can return to the previous POC upon discharge or alternatively, use home dressing to left heel  Prevalon Boots are provided and may be taken home for continued off loading. TUrning and repositioning is in place and will continue, offloading the sacrum.  WOC nursing team will not follow, but will remain available to this patient, the nursing and medical teams.  Please re-consult if needed. Thanks, Ladona Mow, MSN, RN, GNP, [facility]  Pager# [phone]

## 2020-02-22 NOTE — Progress Notes (Signed)
  Echocardiogram 2D Echocardiogram has been performed.  Jacob [name] 2020/02/28, 10:28 AM

## 2020-02-22 NOTE — Consult Note (Signed)
Cardiology Consultation:   Patient ID: Jacob [name] MRN: [id number]; DOB: [birth year]   Admission date: 03-17-20  Primary Care Provider: Malka So., MD [facility] Cardiologist: [facility] Marlena Clipper MD [facility] Electrophysiologist:  None   Chief Complaint:  Cardiac Risk Stratifiation  Patient Profile:   Jacob [name] is a 87 y.o. male with history of CAD s/p prior CABG (06 LIMA to Lad, SVG to OM1 Aorta to PDA and RPL; patent grafts 2016),  AAA s/p repair, HTN, HLD with CKD baseline creatine 1.91.  Prior [facility] Echo 2017 (unable to see result, to comment on reduced EF in 12/2019 cardiology note  History of Present Illness:   Jacob [name] notes that he is feeling tired but ok.    Has had recent ortho procedure in December for R hip Fracture.  Had been at [facility] for rehab and returned two week ago. Patient had a fall at home.  At the time noted weakness.  He can remember the fall, and reminders sliding down.  Notes that he doesn't remember is 2006 anginal equivalent (before above CABG) but things his chest pain is different because he didn't feel chest pain before or during his fall.   No palpitations or black out during fall.  Seen 1/22 fall was evaluated for L femoral neck fracture. Found to also be in atrial fibrillation.  Had slight elevation in novel troponins.  Notes that he has never had a diagnosis of A fib or HF before, had echo in 2017 with no mention of this and no issues seein his cardiologist in 12/2019.  Since he has been her he has received some IVF, and pain control and been evaluated for possible surgery.  Patient has been more and more active prior to fall, but recently came from SNF.  While at [facility], has had non radiation chest pain.  Discomfort occurs in his chest and described as dullness, nothing makes it worse, and improves with pain medications.  Presently, no shortness of breath, DOE.  No PND or orthopnea.  Denies weight gain, but daughter and wife have  noted post 12/2019 leg swelling with pitting and without abdominal swelling.  Does not notice a difference presently in AF.  Cardiology called for the evaluation of new biomarkers, decrease in LVEF, RV dysfunction, and new AF.  Past Medical History:  Diagnosis Date  . Chronic kidney disease, stage 3b (HCC) 01/21/2020  . Coronary artery disease   . Hypertension     Past Surgical History:  Procedure Laterality Date  . ABDOMINAL AORTIC ANEURYSM REPAIR    . COLECTOMY    . CORONARY ARTERY BYPASS GRAFT    . FEMUR IM NAIL Right 01/12/2020   Procedure: INTRAMEDULLARY (IM) NAIL FEMORAL;  Surgeon: Durene Romans, MD;  Location: [facility];  Service: Orthopedics;  Laterality: Right;  . HERNIA REPAIR       Medications Prior to Admission: Prior to Admission medications   Medication Sig Start Date End Date Taking? Authorizing Provider  acetaminophen (TYLENOL) 500 MG tablet Take 1,000 mg by mouth every 8 (eight) hours as needed for mild pain. 01/19/20  Yes [provider]  Amino Acids-Protein Hydrolys (FEEDING SUPPLEMENT, PRO-STAT 64,) LIQD Take 30 mLs by mouth 2 (two) times daily between meals. 01/29/20  Yes Fargo, Amy E, NP  aspirin EC 81 MG tablet Take 81 mg by mouth daily. Swallow whole.   Yes [provider]  carvedilol (COREG) 6.25 MG tablet Take 1 tablet (6.25 mg total) by mouth 2 (two) times daily  with a meal. 01/29/20  Yes Fargo, Amy E, NP  Cholecalciferol (VITAMIN D) 125 MCG (5000 UT) CAPS Take 5,000 Units by mouth daily.   Yes [provider]  loperamide (IMODIUM) 2 MG capsule Take 2 mg by mouth every other day.   Yes [provider]  pravastatin (PRAVACHOL) 80 MG tablet Take 1 tablet (80 mg total) by mouth every evening. 01/29/20  Yes Fargo, Amy E, NP  Travoprost, BAK Free, (TRAVATAN) 0.004 % SOLN ophthalmic solution Place 1 drop into both eyes at bedtime. 01/29/20  Yes Fargo, Amy E, NP  vitamin B-12 (CYANOCOBALAMIN) 1000 MCG tablet Take 1,000 mcg by mouth  daily.   Yes [provider]  alum & mag hydroxide-simeth (MAALOX/MYLANTA) 200-200-20 MG/5ML suspension Take 30 mLs by mouth every 4 (four) hours as needed for indigestion. Patient not taking: Reported on 02/10/2020 01/16/20   Arnetha Courser, MD  amLODipine (NORVASC) 5 MG tablet Take 1 tablet (5 mg total) by mouth daily. Patient not taking: Reported on 02/15/2020 01/29/20   Octavia Heir, NP  docusate sodium (COLACE) 100 MG capsule Take 1 capsule (100 mg total) by mouth 2 (two) times daily. Patient not taking: Reported on 03/01/2020 01/16/20   Arnetha Courser, MD  feeding supplement (ENSURE ENLIVE / ENSURE PLUS) LIQD Take 237 mLs by mouth 2 (two) times daily between meals. Patient not taking: Reported on 02/20/2020 01/16/20   Arnetha Courser, MD  folic acid (FOLVITE) 1 MG tablet Take 1 tablet (1 mg total) by mouth daily. Patient not taking: Reported on 02/19/2020 01/16/20   Arnetha Courser, MD  Multiple Vitamin (MULTIVITAMIN WITH MINERALS) TABS tablet Take 1 tablet by mouth daily. Patient not taking: Reported on 02/20/2020 01/16/20   Arnetha Courser, MD  olmesartan (BENICAR) 20 MG tablet Take 1 tablet (20 mg total) by mouth daily. Patient not taking: Reported on 02/09/2020 01/29/20   Octavia Heir, NP     Allergies:   No Known Allergies  Social History:   Social History   Socioeconomic History  . Marital status: Married    Spouse name: Not on file  . Number of children: Not on file  . Years of education: Not on file  . Highest education level: Not on file  Occupational History  . Not on file  Tobacco Use  . Smoking status: Former Games developer  . Smokeless tobacco: Never Used  . Tobacco comment: quit 50years ago  Vaping Use  . Vaping Use: Never used  Substance and Sexual Activity  . Alcohol use: Yes    Comment: socially  . Drug use: Never  . Sexual activity: Not Currently  Other Topics Concern  . Not on file  Social History Narrative  . Not on file   Social Determinants of Health    Financial Resource Strain: Not on file  Food Insecurity: Not on file  Transportation Needs: Not on file  Physical Activity: Not on file  Stress: Not on file  Social Connections: Not on file  Intimate Partner Violence: Not on file    Family History:   History of coronary artery disease notable for no members. History of heart failure notable for no members. History of arrhythmia notable for no members.  ROS:  Please see the history of present illness.  All other ROS reviewed and negative.     Physical Exam/Data:   Vitals:   02/07/2020 0644 02/09/2020 0645 02/19/2020 0921 02/11/2020 1211  BP: (!) 135/91 (!) 154/67 (!) 126/91 101/73  Pulse:   87 82  Resp: 14 16 18 18   Temp:   (!) 97.5 F (36.4 C)   TempSrc:   Oral   SpO2:   97% 95%   No intake or output data in the 24 hours ending 02/18/2020 1405 Last 3 Weights 01/29/2020 01/27/2020 01/21/2020  Weight (lbs) 156 lb 158 lb 6.4 oz 158 lb 6.4 oz  Weight (kg) 70.761 kg 71.85 kg 71.85 kg     There is no height or weight on file to calculate BMI.  General:  Elderly male well developed, in no acute distress (dozes when not actively engaged in conversation but easily rousable) HEENT: normal Lymph: no adenopathy Neck: no JVD Endocrine:  No thryomegaly Vascular: No carotid bruits; FA pulses 2+ bilaterally without bruits  Cardiac:  normal S1, S2; irregularly irregular; no murmur  Lungs:  clear to auscultation bilaterally, no wheezing, rhonchi or rales  Abd: soft, nontender, no hepatomegaly  Ext: +2 edema bilaterally Musculoskeletal:  No deformities upper extremity; with active fracture (L lower extremity) deferred invasive exam Skin: warm, there are multiple keratinic moles on his anterior chest Neuro:  CNs 2-12 intact, no focal abnormalities noted Psych:  Normal affect   EKG:  The ECG that was done  was personally reviewed and demonstrates  02/25/2020:  A fib rate 90 non specific TWI 01/06/2021: a fib 76 with ashman beats and  TWI  Relevant CV Studies: EF 40% IVC dilated, RV McConnell's Sign Limited acoutic windows with incomplete visualization of the LV  endocardium.  2. Based on limited views, left ventricular ejection fraction, by  estimation, is 40 to 45%. The left ventricle has mildly decreased  function.  3. There is severe hypokinesis of the basal-to-mid anterior and all  septal LV walls. There is abnormal septal motion due to prior coronary  bypass surgery.  4. Right ventricular systolic function is moderately reduced. The right  ventricular size is moderately enlarged. There is moderately elevated  pulmonary artery systolic pressure.  5. The RV apex appears hyperdynamic compared to the base. Unclear  chronicity, however, in the right clinical context, this would be  concerning for possible pulmonary embolism. Clinical correlation advised.  6. Left atrial size was mildly dilated.  7. Right atrial size was severely dilated.  8. The mitral valve is grossly normal. Trivial mitral valve  regurgitation.  9. The aortic valve is tricuspid. There is mild calcification of the  aortic valve. There is mild thickening of the aortic valve. Aortic valve  regurgitation is not visualized. Mild aortic valve sclerosis is present,  with no evidence of aortic valve  stenosis.  10. The inferior vena cava is dilated in size with <50% respiratory  variability, suggesting right atrial pressure of 15 mmHg.  11. Consider work-up for pulmonary embolism if there is clinical suspicion  given moderately enlarged RV with moderate systolic dyfunction and apical  apical hyperkinesis compared to the RV base.   Laboratory Data:  High Sensitivity Troponin:   Recent Labs  Lab 012/08/2020 1338 012/08/2020 1631 02/20/2020 0005 02/26/2020 0124 02/26/2020 0852  TROPONINIHS 29* 38* 127* 172* 294*      Chemistry Recent Labs  Lab 012/08/2020 1338 02/20/2020 0011  NA 137 139  K 4.6 5.2*  CL 105 108  CO2 23 25  GLUCOSE 166* 136*   BUN 63* 65*  CREATININE 1.93* 2.12*  CALCIUM 8.3* 8.1*  GFRNONAA 32* 28*  ANIONGAP 9 6    Recent Labs  Lab 012/08/2020 1338  PROT 5.4*  ALBUMIN 2.9*  AST 16  ALT 16  ALKPHOS 141*  BILITOT 0.3   Hematology Recent Labs  Lab 02/05/2020 1338 02/28/2020 0011  WBC 14.4* 12.1*  RBC 2.57* 2.44*  HGB 8.8* 8.2*  HCT 28.2* 27.1*  MCV 109.7* 111.1*  MCH 34.2* 33.6  MCHC 31.2 30.3  RDW 15.9* 16.0*  PLT 173 143*   BNPNo results for input(s): BNP, PROBNP in the last 168 hours.  DDimer  Recent Labs  Lab 02/29/2020 1258  DDIMER 4.81*     Radiology/Studies:  ECHOCARDIOGRAM COMPLETE  Result Date: 02/02/2020    ECHOCARDIOGRAM REPORT   Patient Name:   Darrin Luis Date of Exam: 02/15/2020 Medical Rec #:  [id number]         Height:       70.0 in Accession #:    [id number]        Weight:       156.0 lb Date of Birth:  December 27, 1924         BSA:          1.878 m Patient Age:    89 years          BP:           126/91 mmHg Patient Gender: M                 HR:           91 bpm. Exam Location:  Inpatient Procedure: 2D Echo and Intracardiac Opacification Agent Indications:    Elevated Troponin  History:        Patient has no prior history of Echocardiogram examinations.                 CAD, Prior CABG; Risk Factors:Hypertension.  Sonographer:    Thurman Coyer RDCS (AE) Referring Phys: [phone] RALPH A NETTEY IMPRESSIONS  1. Limited acoutic windows with incomplete visualization of the LV endocardium.  2. Based on limited views, left ventricular ejection fraction, by estimation, is 40 to 45%. The left ventricle has mildly decreased function.  3. There is severe hypokinesis of the basal-to-mid anterior and all septal LV walls. There is abnormal septal motion due to prior coronary bypass surgery.  4. Right ventricular systolic function is moderately reduced. The right ventricular size is moderately enlarged. There is moderately elevated pulmonary artery systolic pressure.  5. The RV apex appears hyperdynamic  compared to the base. Unclear chronicity, however, in the right clinical context, this would be concerning for possible pulmonary embolism. Clinical correlation advised.  6. Left atrial size was mildly dilated.  7. Right atrial size was severely dilated.  8. The mitral valve is grossly normal. Trivial mitral valve regurgitation.  9. The aortic valve is tricuspid. There is mild calcification of the aortic valve. There is mild thickening of the aortic valve. Aortic valve regurgitation is not visualized. Mild aortic valve sclerosis is present, with no evidence of aortic valve stenosis. 10. The inferior vena cava is dilated in size with <50% respiratory variability, suggesting right atrial pressure of 15 mmHg. 11. Consider work-up for pulmonary embolism if there is clinical suspicion given moderately enlarged RV with moderate systolic dyfunction and apical apical hyperkinesis compared to the RV base. Comparison(s): No prior Echocardiogram. FINDINGS  Left Ventricle: Limited acoutic windows with incomplete visualization of the LV endocardium. Based on limited views, left ventricular ejection fraction, by estimation, is 40 to 45%. The left ventricle has mildly decreased function. The left ventricle demonstrates regional wall motion abnormalities. There is severe hypokinesis of the basal-to-mid anterior and  basal-to-apical septal walls. The left ventricular internal cavity size was normal in size. There is no left ventricular hypertrophy. Abnormal (paradoxical) septal motion consistent with post-operative status. Left ventricular diastolic parameters are indeterminate. Right Ventricle: The right ventricular size is moderately enlarged. No increase in right ventricular wall thickness. Right ventricular systolic function is moderately reduced. There is moderately elevated pulmonary artery systolic pressure. The tricuspid  regurgitant velocity is 3.21 m/s, and with an assumed right atrial pressure of 8 mmHg, the estimated  right ventricular systolic pressure is 49.2 mmHg. Left Atrium: Left atrial size was mildly dilated. Right Atrium: Right atrial size was severely dilated. Pericardium: There is no evidence of pericardial effusion. Mitral Valve: The mitral valve is grossly normal. There is mild thickening of the mitral valve leaflet(s). Trivial mitral valve regurgitation. Tricuspid Valve: The tricuspid valve is normal in structure. Tricuspid valve regurgitation is mild. Aortic Valve: The aortic valve is tricuspid. There is mild calcification of the aortic valve. There is mild thickening of the aortic valve. Aortic valve regurgitation is not visualized. Mild aortic valve sclerosis is present, with no evidence of aortic valve stenosis. Pulmonic Valve: The pulmonic valve was normal in structure. Pulmonic valve regurgitation is trivial. Aorta: The aortic root is normal in size and structure. Venous: The inferior vena cava is dilated in size with less than 50% respiratory variability, suggesting right atrial pressure of 15 mmHg. IAS/Shunts: No atrial level shunt detected by color flow Doppler.  LEFT VENTRICLE PLAX 2D LVIDd:         4.50 cm LVIDs:         3.50 cm LV PW:         1.00 cm LV IVS:        1.00 cm LVOT diam:     2.30 cm LV SV:         40 LV SV Index:   21 LVOT Area:     4.15 cm  LEFT ATRIUM             Index       RIGHT ATRIUM           Index LA diam:        4.80 cm 2.56 cm/m  RA Area:     26.90 cm LA Vol (A2C):   85.0 ml 45.26 ml/m RA Volume:   85.50 ml  45.52 ml/m LA Vol (A4C):   52.9 ml 28.17 ml/m LA Biplane Vol: 71.4 ml 38.02 ml/m  AORTIC VALVE LVOT Vmax:   60.10 cm/s LVOT Vmean:  36.700 cm/s LVOT VTI:    0.096 m  AORTA Ao Root diam: 3.30 cm TRICUSPID VALVE TR Peak grad:   41.2 mmHg TR Vmax:        321.00 cm/s  SHUNTS Systemic VTI:  0.10 m Systemic Diam: 2.30 cm Laurance FlattenHeather Pemberton MD Electronically signed by Laurance FlattenHeather Pemberton MD Signature Date/Time: 03/01/2020/12:24:57 PM    Final      Assessment and Plan:    Preoperative Risk Assessment - The Revised Cardiac Risk Index = 3 = 11%; high risk estimated risk of perioperative myocardial infarction, pulmonary edema, ventricular fibrillation, cardiac arrest, or complete heart block.  - < 4  functional mets - In the presence of new RV dysfunction; would benefit from V/Q scan prior non-urgent or emergent surgery; fat embolism also on DDx - Unclear if LE edema reflect volume overload, but IV is dilated, will send BNP for 02/04/2020; and if V/Q scan is negative would consider low dose IV diuresis -  The patient and family are aware of the surgical risks  If needing to proceed to surgery urgently or emergently - Our service is available as needed in the peri-operative period.    New Atrial Fibrillation,  - Risk factors include HTN, new cardiomyopathy - CHADSVASC=5. - TSH pending with next labs, imaging notable for mild left atrial dilation - when appropriate from surgical perspective, would discuss systemic anticoagulation - Continue rate control with Coreg 6.25 mg BID  Coronary Artery Disease; Obstructive - asymptomatic - anatomy: LIMA to Lad, SVG to OM1 Aorta to PDA and RPL; patent grafts 2016),  AAA s/p repair, HTN, HLD with CKD base - ASA or AC as above when appropriate per surgery - continue statin, goal LDL < 70 - continue BB as above - It is possible that patient has had issues with a graft going down; or related to new AF; given lack of symptoms and creatinine this high, high bar to pursue LHC and would likely medically manage (ACS not likely the cause of the fall given patient and wife's story)  Ischemic cardiomyopathy CKD III b - NYHA class II, Stage C, hypervolemic, etiology from graft dysfunction vs asymptomatic AF - Diuretic regimen: BNP tomorrow; if no PE will attempt lasix 20 mg IV - Strict I/Os, daily weights, and fluid restriction of < 2 L  - Replace electrolytes PRN and keep K>4 and Mg>2. - daily BMP/Mg  - Coreg as above   Discussed  with patient, family, nursing, and primary MD.  We will continue to follow  Risk Assessment/Risk Scores:    TIMI Risk Score for Unstable Angina or Non-ST Elevation MI:   The patient's TIMI risk score is 5, which indicates a 26% risk of all cause mortality, new or recurrent myocardial infarction or need for urgent revascularization in the next 14 days.  New York Heart Association (NYHA) Functional Class NYHA Class II  CHA2DS2-VASc Score = 5  This indicates a 7.2% annual risk of stroke. The patient's score is based upon: CHF History: Yes HTN History: Yes Diabetes History: No Stroke History: No Vascular Disease History: Yes Age Score: 2 Gender Score: 0     For questions or updates, please contact [facility] Please consult [email or web address] for contact info under     Signed, Christell Constant, MD  02/28/2020 2:05 PM

## 2020-02-22 NOTE — Progress Notes (Signed)
Pt tx from room via bed . Applied monitor.

## 2020-02-22 NOTE — Progress Notes (Signed)
Critical Lab  Troponin 127 Provider has been made aware. Lab has been redrawn

## 2020-02-22 NOTE — Interval H&P Note (Signed)
History and Physical Interval Note:  02/27/2020 7:20 AM  Jacob [name]  has presented today for surgery, with the diagnosis of left femoral neck fracture.  The various methods of treatment have been discussed with the patient and family. After consideration of risks, benefits and other options for treatment, the patient has consented to  Procedure(s): TOTAL HIP ARTHROPLASTY ANTERIOR APPROACH (Left) as a surgical intervention.  The patient's history has been reviewed, patient examined, no change in status, stable for surgery.  I have reviewed the patient's chart and labs.  Questions were answered to the patient's satisfaction.     Homero Fellers Cynthis Purington

## 2020-02-22 NOTE — Progress Notes (Signed)
Patient ID: Jacob [name], male   DOB: 09-03-1924, 87 y.o.   MRN: [id number] Received call about [name]'s new injury, left femoral neck fracture  Will need hemiarthroplasty Will address either today or tomorrow  Full consult note to follow

## 2020-02-23 ENCOUNTER — Inpatient Hospital Stay (HOSPITAL_COMMUNITY): Payer: Medicare Other

## 2020-02-23 ENCOUNTER — Encounter (HOSPITAL_COMMUNITY): Payer: Self-pay | Admitting: Internal Medicine

## 2020-02-23 DIAGNOSIS — I2693 Single subsegmental pulmonary embolism without acute cor pulmonale: Secondary | ICD-10-CM

## 2020-02-23 DIAGNOSIS — I25810 Atherosclerosis of coronary artery bypass graft(s) without angina pectoris: Secondary | ICD-10-CM | POA: Diagnosis not present

## 2020-02-23 DIAGNOSIS — R7989 Other specified abnormal findings of blood chemistry: Secondary | ICD-10-CM | POA: Diagnosis not present

## 2020-02-23 DIAGNOSIS — S72002A Fracture of unspecified part of neck of left femur, initial encounter for closed fracture: Secondary | ICD-10-CM

## 2020-02-23 DIAGNOSIS — I4891 Unspecified atrial fibrillation: Secondary | ICD-10-CM

## 2020-02-23 DIAGNOSIS — I82412 Acute embolism and thrombosis of left femoral vein: Secondary | ICD-10-CM

## 2020-02-23 DIAGNOSIS — N1832 Chronic kidney disease, stage 3b: Secondary | ICD-10-CM | POA: Diagnosis not present

## 2020-02-23 DIAGNOSIS — L8990 Pressure ulcer of unspecified site, unspecified stage: Secondary | ICD-10-CM | POA: Insufficient documentation

## 2020-02-23 DIAGNOSIS — I10 Essential (primary) hypertension: Secondary | ICD-10-CM | POA: Diagnosis not present

## 2020-02-23 LAB — BASIC METABOLIC PANEL
Anion gap: 11 (ref 5–15)
BUN: 88 mg/dL — ABNORMAL HIGH (ref 8–23)
CO2: 19 mmol/L — ABNORMAL LOW (ref 22–32)
Calcium: 7.8 mg/dL — ABNORMAL LOW (ref 8.9–10.3)
Chloride: 108 mmol/L (ref 98–111)
Creatinine, Ser: 2.07 mg/dL — ABNORMAL HIGH (ref 0.61–1.24)
GFR, Estimated: 29 mL/min — ABNORMAL LOW (ref >60)
Glucose, Bld: 132 mg/dL — ABNORMAL HIGH (ref 70–99)
Potassium: 6 mmol/L — ABNORMAL HIGH (ref 3.5–5.1)
Sodium: 138 mmol/L (ref 135–145)

## 2020-02-23 LAB — BPAM RBC
Blood Product Expiration Date: 202202182359
ISSUE DATE / TIME: 202201231723
Unit Type and Rh: 6200

## 2020-02-23 LAB — CBC
HCT: 25 % — ABNORMAL LOW (ref 39.0–52.0)
HCT: 28.9 % — ABNORMAL LOW (ref 39.0–52.0)
Hemoglobin: 7.6 g/dL — ABNORMAL LOW (ref 13.0–17.0)
Hemoglobin: 8.8 g/dL — ABNORMAL LOW (ref 13.0–17.0)
MCH: 32.2 pg (ref 26.0–34.0)
MCH: 32.3 pg (ref 26.0–34.0)
MCHC: 30.4 g/dL (ref 30.0–36.0)
MCHC: 30.4 g/dL (ref 30.0–36.0)
MCV: 105.9 fL — ABNORMAL HIGH (ref 80.0–100.0)
MCV: 106.4 fL — ABNORMAL HIGH (ref 80.0–100.0)
Platelets: 147 10*3/uL — ABNORMAL LOW (ref 150–400)
Platelets: 149 10*3/uL — ABNORMAL LOW (ref 150–400)
RBC: 2.35 MIL/uL — ABNORMAL LOW (ref 4.22–5.81)
RBC: 2.73 MIL/uL — ABNORMAL LOW (ref 4.22–5.81)
RDW: 19.4 % — ABNORMAL HIGH (ref 11.5–15.5)
RDW: 19.5 % — ABNORMAL HIGH (ref 11.5–15.5)
WBC: 12 10*3/uL — ABNORMAL HIGH (ref 4.0–10.5)
WBC: 15.7 10*3/uL — ABNORMAL HIGH (ref 4.0–10.5)
nRBC: 0.8 % — ABNORMAL HIGH (ref 0.0–0.2)
nRBC: 1.7 % — ABNORMAL HIGH (ref 0.0–0.2)

## 2020-02-23 LAB — HEPARIN LEVEL (UNFRACTIONATED)
Heparin Unfractionated: 1.02 IU/mL — ABNORMAL HIGH (ref 0.30–0.70)
Heparin Unfractionated: 0.7 IU/mL (ref 0.30–0.70)

## 2020-02-23 LAB — TYPE AND SCREEN
ABO/RH(D): A POS
Antibody Screen: NEGATIVE
Unit division: 0

## 2020-02-23 LAB — TSH: TSH: 3.177 u[IU]/mL (ref 0.350–4.500)

## 2020-02-23 LAB — BRAIN NATRIURETIC PEPTIDE: B Natriuretic Peptide: 1251.4 pg/mL — ABNORMAL HIGH (ref 0.0–100.0)

## 2020-02-23 LAB — POTASSIUM: Potassium: 5.5 mmol/L — ABNORMAL HIGH (ref 3.5–5.1)

## 2020-02-23 MED ORDER — HEPARIN (PORCINE) 25000 UT/250ML-% IV SOLN
950.0000 [IU]/h | INTRAVENOUS | Status: DC
  Administered 2020-02-23 (×2): 950 [IU]/h via INTRAVENOUS
  Filled 2020-02-23: qty 250

## 2020-02-23 MED ORDER — BISACODYL 5 MG PO TBEC: 5.0000 mg | DELAYED_RELEASE_TABLET | Freq: Every day | ORAL | Status: DC | PRN

## 2020-02-23 MED ORDER — FUROSEMIDE 10 MG/ML IJ SOLN
20.0000 mg | Freq: Once | INTRAMUSCULAR | Status: AC
  Administered 2020-02-23: 20 mg via INTRAVENOUS
  Filled 2020-02-23: qty 2

## 2020-02-23 MED ORDER — HEPARIN (PORCINE) 25000 UT/250ML-% IV SOLN: 1000.0000 [IU]/h | INTRAVENOUS | Status: DC

## 2020-02-23 MED ORDER — SODIUM ZIRCONIUM CYCLOSILICATE 10 G PO PACK
10.0000 g | PACK | Freq: Once | ORAL | Status: AC
  Administered 2020-02-23: 10 g via ORAL
  Filled 2020-02-23: qty 1

## 2020-02-23 MED ORDER — MORPHINE SULFATE (CONCENTRATE) 10 MG/0.5ML PO SOLN: 10.0000 mg | ORAL | Status: DC | PRN

## 2020-02-23 MED ORDER — FOOD THICKENER (SIMPLYTHICK)
1.0000 | ORAL | Status: DC | PRN
  Filled 2020-02-23: qty 1

## 2020-02-23 MED ORDER — ENSURE ENLIVE PO LIQD
237.0000 mL | Freq: Two times a day (BID) | ORAL | Status: DC
  Administered 2020-02-23: 237 mL via ORAL

## 2020-02-23 MED ORDER — TECHNETIUM TO 99M ALBUMIN AGGREGATED
4.4000 | Freq: Once | INTRAVENOUS | Status: AC | PRN
  Administered 2020-02-23: 4.4 via INTRAVENOUS

## 2020-02-23 MED ORDER — ACETAMINOPHEN 500 MG PO TABS
1000.0000 mg | ORAL_TABLET | Freq: Four times a day (QID) | ORAL | Status: DC
  Administered 2020-02-23: 1000 mg via ORAL
  Filled 2020-02-23: qty 2

## 2020-02-23 NOTE — Progress Notes (Signed)
ANTICOAGULATION CONSULT NOTE   Pharmacy Consult for heparin infusion Indication: pulmonary embolus  No Known Allergies  Patient Measurements: Weight: 70.3 kg  Vital Signs: Temp: 97.7 F (36.5 C) (01/23 2025) Temp Source: Axillary (01/23 2025) BP: 112/61 (01/23 2025) Pulse Rate: 80 (01/23 2025)  Labs: Recent Labs    02/20/2020 1338 02/27/2020 1631 2020/03/16 0011 Mar 16, 2020 0124 03/16/20 0852 16-Mar-2020 1258 02/28/2020 0005  HGB 8.8*  --  8.2*  --   --   --  8.8*  HCT 28.2*  --  27.1*  --   --   --  28.9*  PLT 173  --  143*  --   --   --  147*  APTT  --   --   --   --   --  39*  --   HEPARINUNFRC  --   --   --   --   --   --  1.02*  CREATININE 1.93*  --  2.12*  --   --   --  2.07*  TROPONINIHS 29*   < >  --  172* 294* 266*  --    < > = values in this interval not displayed.    Estimated Creatinine Clearance: 21.6 mL/min (A) (by C-G formula based on SCr of 2.07 mg/dL (H)).   Medical History: Past Medical History:  Diagnosis Date  . Chronic kidney disease, stage 3b (HCC) 01/21/2020  . Coronary artery disease   . Hypertension     Medications:  No PTA anticoagulation  Assessment: Pharmacy consulted to dose heparin infusion for possible PE with elevated d-dimer and RV dysfunction.  Patient received enoxaparin 40 mg subq once on 02/09/2020 1643 and once on 03-16-20 0941.  02/19/2020  Heparin level = 1.02 with heparin gtt @ 1200 units/hr  Hgb 8.8, PLTC 147K   No complications of therapy noted  Goal of Therapy:  Heparin level 0.3-0.7 units/ml Monitor platelets by anticoagulation protocol: Yes   Plan:  Hold heparin gtt x 1 hr then restart @ 1000 units/hr Check heparin level 8 hr after heparin restarted at reduced rate Check heparin level and CBC daily while on heparin  Terrilee Files, PharmD 02/13/2020 1:17 AM

## 2020-02-23 NOTE — Progress Notes (Signed)
SLP Cancellation Note  Patient Details Name: Jacob [name] MRN: [id number] DOB: 12/19/1924   Cancelled treatment:       Reason Eval/Treat Not Completed: Patient's level of consciousness. Patient present in radiology suite for MBS, however he was extremely lethargic and only able to open eyes briefly before closing eyes again. SLP did not administer any barium and test will have to be rescheduled when patient adequately alert.  Angela Nevin, MA, CCC-SLP Speech Therapy

## 2020-02-23 NOTE — Plan of Care (Signed)
  Problem: Clinical Measurements: Goal: Diagnostic test results will improve Outcome: Not Progressing   Problem: Nutrition: Goal: Adequate nutrition will be maintained Outcome: Not Progressing   Problem: Education: Goal: Knowledge of General Education information will improve Description: Including pain rating scale, medication(s)/side effects and non-pharmacologic comfort measures Outcome: Completed/Met

## 2020-02-23 NOTE — Progress Notes (Signed)
Radiology report received from [facility].  MD made aware of results. Jacob [name]

## 2020-02-23 NOTE — Progress Notes (Addendum)
Initial Nutrition Assessment  DOCUMENTATION CODES:   Non-severe (moderate) malnutrition in context of chronic illness  INTERVENTION:  - continue Magic Cup TID with meals per diet order protocol, each supplement provides 290 kcal and 9 grams of protein. - will order Ensure Enlive BID to be thickened to nectar-thick consistency, each supplement provides 350 kcal and 20 grams of protein. - will monitor for GOC.  NUTRITION DIAGNOSIS:   Moderate Malnutrition related to chronic illness as evidenced by mild fat depletion,mild muscle depletion,moderate fat depletion,moderate muscle depletion.  GOAL:   Patient will meet greater than or equal to 90% of their needs  MONITOR:   PO intake,Supplement acceptance,Labs,Weight trends  REASON FOR ASSESSMENT:   Consult Hip fracture protocol  ASSESSMENT:   87 y.o. male with medical history of CAD, stage 3 CKD, HTN, and recent R hip fracture. Patient presented to the ED secondary to fall, suffering a L hip fracture. Plan for surgical repair complicated by concern for possible acute cardiac process.  Patient consumed 25-50% at meals yesterday, per documentation in the flow sheet. Patient laying in bed and does not participate in the conversation. His wife is at bedside and reports all information.  Patients was hospitalized 6 weeks ago and at that time he was intubated for surgical fixation. Wife reports that since that hospitalization, patient has had difficulty with swallowing.   He went to Radiology earlier today but was too lethargic to safely participate in MBS. Wife reports uncertainty on if family and patient will desire re-attempt at this test.   Patient is missing most of his molars which can make chewing difficult. At home, patient was provided with any food that he wanted, but these items were often cut into small pieces. He was able to self-feed at home.   Weight yesterday was 156 lb. Wife reports UBW of 145-150 lb and that patient last  weighed this around 01/16/20. She feels he has lost a significant amount of weight since that time. Weight on 01/12/20 was documented as 148 lb.   Unable to assess BLE during NFPE, but moderate pitting edema to RLE and deep pitting edema to LLE documented in the edema section of flow sheet. This likely accounts for weight gain in the past month and a half.   Cardiology following and note from this afternoon states possible work-up for pulmonary embolism, new onset afib. Cleared for surgery for L hip fx from Cardiology standpoint.     Labs reviewed; K: 5.5 mmol/l, BUN: 88 mg/dl, creatinine: 9.37 mg/dl, Ca: 7.8 mg/dl, GFR: 29 ml/min.  Medications reviewed; 100 mg colace BID, 1 mg folvite/day, 20 mg IV lasix x1 dose 1/24, 1 tablet multivitamin with minerals/day, 10 g lokelma x1 dose 1/24. IVF; NS @ 75 ml/hr.      NUTRITION - FOCUSED PHYSICAL EXAM:  Flowsheet Row Most Recent Value  Orbital Region Unable to assess  Upper Arm Region Severe depletion  Thoracic and Lumbar Region Mild depletion  Buccal Region Moderate depletion  Temple Region Moderate depletion  Clavicle Bone Region Moderate depletion  Clavicle and Acromion Bone Region Severe depletion  Scapular Bone Region Moderate depletion  Dorsal Hand Mild depletion  Patellar Region Unable to assess  Anterior Thigh Region Unable to assess  Posterior Calf Region Unable to assess  Edema (RD Assessment) Unable to assess  Hair Reviewed  Eyes Unable to assess  Mouth Reviewed  Skin Reviewed  Nails Reviewed       Diet Order:   Diet Order  DIET - DYS 1 Room service appropriate? Yes; Fluid consistency: Nectar Thick  Diet effective now                 EDUCATION NEEDS:   No education needs have been identified at this time  Skin:  Skin Assessment: Skin Integrity Issues: Skin Integrity Issues:: Stage II,DTI DTI: L heel Stage II: sacrum  Last BM:  1/24 (type 6)  Height:   Ht Readings from Last 1 Encounters:   03/01/2020 5\' 8"  (1.727 m)    Weight:   Wt Readings from Last 1 Encounters:  02/18/2020 70.3 kg    Estimated Nutritional Needs:  Kcal:  1500-1700 kcal Protein:  70-80 grams Fluid:  >/= 1.8 L/day     03/01/2020, MS, RD, LDN, CNSC Inpatient Clinical Dietitian RD pager # available in [facility]  After hours/weekend pager # available in [facility]

## 2020-02-23 NOTE — TOC Initial Note (Signed)
Transition of Care [facility]) - Initial/Assessment Note    Patient Details  Name: Cobi Delph MRN: [id number] Date of Birth: 1924/08/19  [facility]) CM/SW Contact:    Lanier Clam, RN Phone Number: 02/20/2020, 10:25 AM  Clinical Narrative:Referral for home vs SNF. Will await post surgery & PT eval. Continue to monitor for d/c plans.                   Expected Discharge Plan: Skilled Nursing Facility Barriers to Discharge: Continued Medical Work up   Patient Goals and CMS Choice     Choice offered to / list presented to : Spouse  Expected Discharge Plan and Services Expected Discharge Plan: Skilled Nursing Facility       Living arrangements for the past 2 months: Single Family Home                                      Prior Living Arrangements/Services Living arrangements for the past 2 months: Single Family Home Lives with:: Spouse Patient language and need for interpreter reviewed:: Yes Do you feel safe going back to the place where you live?: Yes      Need for Family Participation in Patient Care: No (Comment) Care giver support system in place?: Yes (comment)   Criminal Activity/Legal Involvement Pertinent to Current Situation/Hospitalization: No - Comment as needed  Activities of Daily Living Home Assistive Devices/Equipment: None ADL Screening (condition at time of admission) Patient's cognitive ability adequate to safely complete daily activities?: Yes Is the patient deaf or have difficulty hearing?: Yes Does the patient have difficulty seeing, even when wearing glasses/contacts?: No Does the patient have difficulty concentrating, remembering, or making decisions?: No Patient able to express need for assistance with ADLs?: Yes Does the patient have difficulty dressing or bathing?: Yes Independently performs ADLs?: No Communication: Independent Dressing (OT): Needs assistance Is this a change from baseline?: Pre-admission  baseline Grooming: Needs assistance Is this a change from baseline?: Pre-admission baseline Feeding: Independent Bathing: Needs assistance Is this a change from baseline?: Pre-admission baseline Toileting: Needs assistance Is this a change from baseline?: Pre-admission baseline In/Out Bed: Needs assistance Is this a change from baseline?: Pre-admission baseline Walks in Home: Needs assistance Is this a change from baseline?: Pre-admission baseline Does the patient have difficulty walking or climbing stairs?: Yes Weakness of Legs: Both Weakness of Arms/Hands: None  Permission Sought/Granted Permission sought to share information with : Case Manager Permission granted to share information with : Yes, Verbal Permission Granted  Share Information with NAME: Case Manager           Emotional Assessment Appearance:: Appears stated age Attitude/Demeanor/Rapport: Gracious Affect (typically observed): Accepting Orientation: : Oriented to Self Alcohol / Substance Use: Not Applicable Psych Involvement: No (comment)  Admission diagnosis:  Closed left hip fracture (HCC) [S72.002A] Left displaced femoral neck fracture (HCC) [S72.002A] Fall, initial encounter [W19.XXXA] Atrial fibrillation, unspecified type [facility]) [I48.91] Patient Active Problem List   Diagnosis Date Noted  . Pressure injury of skin 02/28/2020  . Fracture of femoral neck, left (HCC) 02/03/2020  . Closed left hip fracture (HCC) 02/26/2020  . New onset a-fib (HCC) 02/25/2020  . Essential hypertension 01/21/2020  . Chronic kidney disease, stage 3b (HCC) 01/21/2020  . Peripheral vascular disease (HCC) 01/21/2020  . Dysphagia 01/21/2020  . Coronary artery disease involving coronary bypass graft of native heart without angina pectoris 01/21/2020  . Absolute  anemia 01/21/2020  . S/P right hip fracture 01/19/2020  . Surgery, elective   . Fall   . Malnutrition of moderate degree 01/13/2020  . Hip fracture (HCC) 01/10/2020    PCP:  Malka So., MD Pharmacy:   [facility] - [place], [place] - [address] [place] [address] Phone: [phone] Fax: [phone]     Social Determinants of Health (SDOH) Interventions    Readmission Risk Interventions No flowsheet data found.

## 2020-02-23 NOTE — Progress Notes (Addendum)
PROGRESS NOTE    Jacob [name]  ZOX:[id number] DOB: 05/16/1924 DOA: 02/29/2020 PCP: Malka So., MD   Brief Narrative: Jacob [name] is a 87 y.o. male with a history of CAD, CKD stage IIIb, hypertension, recent right hip fracture. Patient presented secondary to fall, suffering a left hip fracture. Plan for surgical repair complicated by concern for possible acute cardiac process.   Assessment & Plan:   Principal Problem:   Fracture of femoral neck, left (HCC) Active Problems:   S/P right hip fracture   Essential hypertension   Chronic kidney disease, stage 3b (HCC)   Coronary artery disease involving coronary bypass graft of native heart without angina pectoris   Closed left hip fracture (HCC)   New onset a-fib (HCC)   Pressure injury of skin   Left hip fracture Closed fracture. Patient recently in rehab for previous right hip fracture. Orthopedic surgery consulted on admission with plans for left hip repair which is now complicated by possible acute cardiac issue. Surgery on hold pending cardiac workup -Orthopedic surgery recommendations: possible surgery 1/24 pending cardiac workup/recommendations  New onset atrial fibrillation Associated elevated troponin. On Coreg. Rate controlled. TSH 3.177. CHA2DS2-VASc Score is 5. On heparin IV for possible acute PE. Cardiology consulted and will address anticoagulation. -Continue Coreg 6.25 mg BID  Elevated troponin EKG non-specific but with ST-T changes. High risk history. Transthoracic Echocardiogram ordered and significant for low EF of 40-45% with severe hypokinesis of basal-to-mid anterior/septal LV walls. Also significant for moderately reduced RV function with moderately enlarged size. Cardiology consulted. Troponin peak of 294  Right ventricle dysfunction Concern this may be related to acute PE. D-dimer obtained and is 4.81. Patient cannot tolerate CTA chest secondary to renal impairment. -Q scan -Heparin IV -LE  venous duplex  CKD stage IIIb Baseline creatinine appears to be around 1.7-1.9. Slightly elevated possibly related to transient hypotension. Improving. -BMP in AM  Hyperkalemia Mild, increasing. -[place] -Repeat potassium this afternoon  Dysphagia SLP consulted with recommendations for dysphagia 1 diet. Full recommendations: Dysphagia 1 (Puree);Thin liquid  Liquid Administration via: Cup;Straw Medication Administration: Crushed with puree Supervision: Patient able to self feed;Full supervision/cueing for compensatory strategies Compensations: Slow rate;Small sips/bites;Follow solids with liquid Postural Changes: Seated upright at 90 degrees;Remain upright for at least 30 minutes after po intake   CAD History of CABG. Per outpatient notes, last cardiac catheterization was in 2016 and showed patent grafts. Patient is on aspirin, Coreg, olmesartan as an outpatient  Chronic anemia No long term records available but prior to previous surgery, hemoglobin was 12.8. Hemoglobin currently stable from previous discharge post surgery. Orthopedic surgery recommending blood transfusion. -Will transfuse 1 unit of PRBC  Hyperlipidemia -Continue pravastatin  Primary hypertension Patient is on amlodipine, olmesartan, Coreg as an outpatient . Patient presented hypotensive; antihypertensives held on admission.  Pressure injury Mid sacrum/left heel. POA   DVT prophylaxis: Heparin IV Code Status:   Code Status: DNR Family Communication: None at bedside. Wife on telephone Disposition Plan: Discharge home vs SNF (more likely) in several days pending cardiac workup, possible PE workup, orthopedic surgery management for hip fracture. Possible patient may discharge home with hospice depending on goals of care discussions.   Consultants:   Orthopedic surgery  Cardiology  Palliative care medicine  Procedures:   TRANSTHORACIC ECHOCARDIOGRAM (03-21-2020) IMPRESSIONS    1. Limited acoutic  windows with incomplete visualization of the LV  endocardium.  2. Based on limited views, left ventricular ejection fraction, by  estimation, is 40 to 45%. The  left ventricle has mildly decreased  function.  3. There is severe hypokinesis of the basal-to-mid anterior and all  septal LV walls. There is abnormal septal motion due to prior coronary  bypass surgery.  4. Right ventricular systolic function is moderately reduced. The right  ventricular size is moderately enlarged. There is moderately elevated  pulmonary artery systolic pressure.  5. The RV apex appears hyperdynamic compared to the base. Unclear  chronicity, however, in the right clinical context, this would be  concerning for possible pulmonary embolism. Clinical correlation advised.  6. Left atrial size was mildly dilated.  7. Right atrial size was severely dilated.  8. The mitral valve is grossly normal. Trivial mitral valve  regurgitation.  9. The aortic valve is tricuspid. There is mild calcification of the  aortic valve. There is mild thickening of the aortic valve. Aortic valve  regurgitation is not visualized. Mild aortic valve sclerosis is present,  with no evidence of aortic valve  stenosis.  10. The inferior vena cava is dilated in size with <50% respiratory  variability, suggesting right atrial pressure of 15 mmHg.  11. Consider work-up for pulmonary embolism if there is clinical suspicion  given moderately enlarged RV with moderate systolic dyfunction and apical  apical hyperkinesis compared to the RV base.    Antimicrobials:  None    Subjective: Wants orange juice. No chest pain or dyspnea.  Objective: Vitals:   2020/03/05 1715 2020-03-05 1742 Mar 05, 2020 2025 02/28/2020 0548  BP: (!) 109/58 107/89 112/61 (!) 138/58  Pulse: 78 82 80 77  Resp: 16 15 18 18   Temp: 97.7 F (36.5 C) 97.7 F (36.5 C) 97.7 F (36.5 C) 97.9 F (36.6 C)  TempSrc: Oral Axillary Axillary   SpO2: 98% 96% 99% 93%  Weight:       Height:        Intake/Output Summary (Last 24 hours) at 02/20/2020 0951 Last data filed at 02/25/2020 0525 Gross per 24 hour  Intake 2519.09 ml  Output 300 ml  Net 2219.09 ml   Filed Weights   03-05-2020 1211 03/05/2020 1500  Weight: 70.8 kg 70.3 kg    Examination:  General exam: Appears calm and comfortable Respiratory system: Clear to auscultation. Respiratory effort normal. Cardiovascular system: S1 & S2 heard, RRR. No murmurs, rubs, gallops or clicks. Gastrointestinal system: Abdomen is nondistended, soft and nontender. No organomegaly or masses felt. Normal bowel sounds heard. Central nervous system: Alert. No focal neurological deficits. Musculoskeletal: BLE 2+ ankle edema. No calf tenderness Skin: No cyanosis. No rashes Psychiatry: Judgement and insight appear impaired   Data Reviewed: I have personally reviewed following labs and imaging studies  CBC Lab Results  Component Value Date   WBC 12.0 (H) 02/09/2020   RBC 2.73 (L) 03/01/2020   HGB 8.8 (L) 02/08/2020   HCT 28.9 (L) 02/02/2020   MCV 105.9 (H) 02/03/2020   MCH 32.2 02/29/2020   PLT 147 (L) 02/06/2020   MCHC 30.4 02/29/2020   RDW 19.4 (H) 02/03/2020   LYMPHSABS 4.5 (H) 02/05/2020   MONOABS 1.1 (H) 02/12/2020   EOSABS 0.0 02/06/2020   BASOSABS 0.0 02/28/2020     Last metabolic panel Lab Results  Component Value Date   NA 138 02/18/2020   K 6.0 (H) 02/19/2020   CL 108 02/17/2020   CO2 19 (L) 02/06/2020   BUN 88 (H) 02/26/2020   CREATININE 2.07 (H) 02/19/2020   GLUCOSE 132 (H) 03/01/2020   GFRNONAA 29 (L) 02/11/2020   CALCIUM 7.8 (L) 02/01/2020  PROT 5.4 (L) 09/26/2020   ALBUMIN 2.9 (L) 09/26/2020   BILITOT 0.3 09/26/2020   ALKPHOS 141 (H) 09/26/2020   AST 16 09/26/2020   ALT 16 09/26/2020   ANIONGAP 11 02/11/2020    CBG (last 3)  No results for input(s): GLUCAP in the last 72 hours.   GFR: Estimated Creatinine Clearance: 21.6 mL/min (A) (by C-G formula based on SCr of 2.07 mg/dL  (H)).  Coagulation Profile: No results for input(s): INR, PROTIME in the last 168 hours.  Recent Results (from the past 240 hour(s))  SARS Coronavirus 2 by RT PCR (hospital order, [facility]) Nasopharyngeal Nasopharyngeal Swab     Status: None   Collection Time: 2020/12/25  2:28 PM   Specimen: Nasopharyngeal Swab  Result Value Ref Range Status   SARS Coronavirus 2 NEGATIVE NEGATIVE Final    Comment: (NOTE) SARS-CoV-2 target nucleic acids are NOT DETECTED.  The SARS-CoV-2 RNA is generally detectable in upper and lower respiratory specimens during the acute phase of infection. The lowest concentration of SARS-CoV-2 viral copies this assay can detect is 250 copies / mL. A negative result does not preclude SARS-CoV-2 infection and should not be used as the sole basis for treatment or other patient management decisions.  A negative result may occur with improper specimen collection / handling, submission of specimen other than nasopharyngeal swab, presence of viral mutation(s) within the areas targeted by this assay, and inadequate number of viral copies (<250 copies / mL). A negative result must be combined with clinical observations, patient history, and epidemiological information.  Fact Sheet for Patients:   BoilerBrush.com.cyhttps://www.fda.gov/media/136312/download  Fact Sheet for Healthcare Providers: https://pope.com/https://www.fda.gov/media/136313/download  This test is not yet approved or  cleared by the [place] FDA and has been authorized for detection and/or diagnosis of SARS-CoV-2 by FDA under an Emergency Use Authorization (EUA).  This EUA will remain in effect (meaning this test can be used) for the duration of the COVID-19 declaration under Section 564(b)(1) of the Act, 21 U.S.C. section 360bbb-3(b)(1), unless the authorization is terminated or revoked sooner.  Performed at [facility], [address] [place], [place] [address]         Radiology  Studies: CT ABDOMEN PELVIS WO CONTRAST  Result Date: 05-08-20 CLINICAL DATA:  Fall, left hip pain, evaluate for retroperitoneal hematoma EXAM: CT ABDOMEN AND PELVIS WITHOUT CONTRAST TECHNIQUE: Multidetector CT imaging of the abdomen and pelvis was performed following the standard protocol without IV contrast. COMPARISON:  Right hip radiographs dated 01/12/2020. CT abdomen/pelvis dated 08/11/2013. FINDINGS: Lower chest: Trace bilateral pleural effusions. Hepatobiliary: Unenhanced liver is unremarkable. Gallbladder is unremarkable. No intrahepatic or extrahepatic ductal dilatation. Pancreas: Within normal limits. Spleen: Within normal limits. Adrenals/Urinary Tract: Adrenal glands are within normal limits. Bilateral renal cysts, including a 7.9 cm exophytic right lower pole renal cyst (series 3/image 38). No hydronephrosis. Bladder is underdistended but unremarkable. Stomach/Bowel: Stomach is within normal limits. No evidence of bowel obstruction. Status post ileocecal resection with appendectomy. Sigmoid diverticulosis, without evidence of diverticulitis. Vascular/Lymphatic: Status post aorta bi-iliac stent repair of and abdominal aortic aneurysm. Maximal AP diameter is 5.2 cm, previously 4.9 cm in 2015, grossly unchanged. Atherosclerotic calcifications of the abdominal aorta and branch vessels. No suspicious abdominopelvic lymphadenopathy Reproductive: Prostate is grossly unremarkable. Other: No abdominopelvic ascites. Musculoskeletal: Acute subcapital left hip fracture with mild foreshortening and impaction (coronal image 58). Subacute intertrochanteric right hip fracture, s/p ORIF, with mild fragmentation anteriorly involving the greater trochanter. Mild degenerative  changes of the visualized thoracolumbar spine. IMPRESSION: No evidence of retroperitoneal hematoma. Acute subcapital left hip fracture, as above. Subacute intertrochanteric right hip fracture status post ORIF, as above. Electronically Signed    By: Charline BillsSriyesh  Krishnan M.D.   On: 02/14/2020 14:02   DG Chest 1 View  Result Date: 02/02/2020 CLINICAL DATA:  Fall. EXAM: CHEST  1 VIEW COMPARISON:  01/10/2020 FINDINGS: Stable mild cardiac enlargement. Status post prior CABG. There is no evidence of pulmonary edema, consolidation, pneumothorax, nodule or pleural fluid. Visualized bony structures are unremarkable. IMPRESSION: Stable mild cardiac enlargement. No acute findings. Electronically Signed   By: Irish LackGlenn  Yamagata M.D.   On: 03/01/2020 13:51   CT Head Wo Contrast  Result Date: 02/29/2020 CLINICAL DATA:  Patient status post fall. EXAM: CT HEAD WITHOUT CONTRAST CT CERVICAL SPINE WITHOUT CONTRAST TECHNIQUE: Multidetector CT imaging of the head and cervical spine was performed following the standard protocol without intravenous contrast. Multiplanar CT image reconstructions of the cervical spine were also generated. COMPARISON:  Brain and C-spine CT 01/10/2020. FINDINGS: CT HEAD FINDINGS Brain: Ventricles and sulci are appropriate for patient's age. Periventricular and subcortical white matter hypodensity compatible with chronic microvascular ischemic changes. No evidence for acute cortically based infarct, intracranial hemorrhage, mass lesion or mass-effect. Vascular: Unremarkable Skull: Intact. Sinuses/Orbits: Paranasal sinuses well aerated. Mastoid air cells unremarkable. Orbits unremarkable. Other: None CT CERVICAL SPINE FINDINGS Alignment: Mild grade 1 anterolisthesis C4 on C5. Otherwise normal anatomic alignment. Skull base and vertebrae: Degenerative changes.  Intact. Soft tissues and spinal canal: No prevertebral fluid or swelling. No visible canal hematoma. Disc levels:  Degenerative disc disease most pronounced C5-6. Upper chest: Unremarkable. Other: None. IMPRESSION: 1. No acute intracranial process. Atrophy and chronic microvascular ischemic changes. 2. No acute cervical spine fracture. Degenerative disc disease. Electronically Signed   By: Annia Beltrew   Davis M.D.   On: 02/02/2020 13:47   CT Cervical Spine Wo Contrast  Result Date: 03/01/2020 CLINICAL DATA:  Patient status post fall. EXAM: CT HEAD WITHOUT CONTRAST CT CERVICAL SPINE WITHOUT CONTRAST TECHNIQUE: Multidetector CT imaging of the head and cervical spine was performed following the standard protocol without intravenous contrast. Multiplanar CT image reconstructions of the cervical spine were also generated. COMPARISON:  Brain and C-spine CT 01/10/2020. FINDINGS: CT HEAD FINDINGS Brain: Ventricles and sulci are appropriate for patient's age. Periventricular and subcortical white matter hypodensity compatible with chronic microvascular ischemic changes. No evidence for acute cortically based infarct, intracranial hemorrhage, mass lesion or mass-effect. Vascular: Unremarkable Skull: Intact. Sinuses/Orbits: Paranasal sinuses well aerated. Mastoid air cells unremarkable. Orbits unremarkable. Other: None CT CERVICAL SPINE FINDINGS Alignment: Mild grade 1 anterolisthesis C4 on C5. Otherwise normal anatomic alignment. Skull base and vertebrae: Degenerative changes.  Intact. Soft tissues and spinal canal: No prevertebral fluid or swelling. No visible canal hematoma. Disc levels:  Degenerative disc disease most pronounced C5-6. Upper chest: Unremarkable. Other: None. IMPRESSION: 1. No acute intracranial process. Atrophy and chronic microvascular ischemic changes. 2. No acute cervical spine fracture. Degenerative disc disease. Electronically Signed   By: Annia Beltrew  Davis M.D.   On: 02/09/2020 13:47   ECHOCARDIOGRAM COMPLETE  Result Date: 12/13/2020    ECHOCARDIOGRAM REPORT   Patient Name:   Jacob [name] Date of Exam: 12/13/2020 Medical Rec #:  [id number]         Height:       70.0 in Accession #:    [id number]        Weight:       156.0 lb Date of  Birth:  [birth year]         BSA:          1.878 m Patient Age:    89 years          BP:           126/91 mmHg Patient Gender: M                 HR:           91 bpm.  Exam Location:  Inpatient Procedure: 2D Echo and Intracardiac Opacification Agent Indications:    Elevated Troponin  History:        Patient has no prior history of Echocardiogram examinations.                 CAD, Prior CABG; Risk Factors:Hypertension.  Sonographer:    Thurman Coyer RDCS (AE) Referring Phys: [phone] Madylin Fairbank A Pragya Lofaso IMPRESSIONS  1. Limited acoutic windows with incomplete visualization of the LV endocardium.  2. Based on limited views, left ventricular ejection fraction, by estimation, is 40 to 45%. The left ventricle has mildly decreased function.  3. There is severe hypokinesis of the basal-to-mid anterior and all septal LV walls. There is abnormal septal motion due to prior coronary bypass surgery.  4. Right ventricular systolic function is moderately reduced. The right ventricular size is moderately enlarged. There is moderately elevated pulmonary artery systolic pressure.  5. The RV apex appears hyperdynamic compared to the base. Unclear chronicity, however, in the right clinical context, this would be concerning for possible pulmonary embolism. Clinical correlation advised.  6. Left atrial size was mildly dilated.  7. Right atrial size was severely dilated.  8. The mitral valve is grossly normal. Trivial mitral valve regurgitation.  9. The aortic valve is tricuspid. There is mild calcification of the aortic valve. There is mild thickening of the aortic valve. Aortic valve regurgitation is not visualized. Mild aortic valve sclerosis is present, with no evidence of aortic valve stenosis. 10. The inferior vena cava is dilated in size with <50% respiratory variability, suggesting right atrial pressure of 15 mmHg. 11. Consider work-up for pulmonary embolism if there is clinical suspicion given moderately enlarged RV with moderate systolic dyfunction and apical apical hyperkinesis compared to the RV base. Comparison(s): No prior Echocardiogram. FINDINGS  Left Ventricle: Limited acoutic windows with  incomplete visualization of the LV endocardium. Based on limited views, left ventricular ejection fraction, by estimation, is 40 to 45%. The left ventricle has mildly decreased function. The left ventricle demonstrates regional wall motion abnormalities. There is severe hypokinesis of the basal-to-mid anterior and basal-to-apical septal walls. The left ventricular internal cavity size was normal in size. There is no left ventricular hypertrophy. Abnormal (paradoxical) septal motion consistent with post-operative status. Left ventricular diastolic parameters are indeterminate. Right Ventricle: The right ventricular size is moderately enlarged. No increase in right ventricular wall thickness. Right ventricular systolic function is moderately reduced. There is moderately elevated pulmonary artery systolic pressure. The tricuspid  regurgitant velocity is 3.21 m/s, and with an assumed right atrial pressure of 8 mmHg, the estimated right ventricular systolic pressure is 49.2 mmHg. Left Atrium: Left atrial size was mildly dilated. Right Atrium: Right atrial size was severely dilated. Pericardium: There is no evidence of pericardial effusion. Mitral Valve: The mitral valve is grossly normal. There is mild thickening of the mitral valve leaflet(s). Trivial mitral valve regurgitation. Tricuspid Valve: The tricuspid valve is normal in structure. Tricuspid valve regurgitation is mild. Aortic Valve: The aortic  valve is tricuspid. There is mild calcification of the aortic valve. There is mild thickening of the aortic valve. Aortic valve regurgitation is not visualized. Mild aortic valve sclerosis is present, with no evidence of aortic valve stenosis. Pulmonic Valve: The pulmonic valve was normal in structure. Pulmonic valve regurgitation is trivial. Aorta: The aortic root is normal in size and structure. Venous: The inferior vena cava is dilated in size with less than 50% respiratory variability, suggesting right atrial pressure  of 15 mmHg. IAS/Shunts: No atrial level shunt detected by color flow Doppler.  LEFT VENTRICLE PLAX 2D LVIDd:         4.50 cm LVIDs:         3.50 cm LV PW:         1.00 cm LV IVS:        1.00 cm LVOT diam:     2.30 cm LV SV:         40 LV SV Index:   21 LVOT Area:     4.15 cm  LEFT ATRIUM             Index       RIGHT ATRIUM           Index LA diam:        4.80 cm 2.56 cm/m  RA Area:     26.90 cm LA Vol (A2C):   85.0 ml 45.26 ml/m RA Volume:   85.50 ml  45.52 ml/m LA Vol (A4C):   52.9 ml 28.17 ml/m LA Biplane Vol: 71.4 ml 38.02 ml/m  AORTIC VALVE LVOT Vmax:   60.10 cm/s LVOT Vmean:  36.700 cm/s LVOT VTI:    0.096 m  AORTA Ao Root diam: 3.30 cm TRICUSPID VALVE TR Peak grad:   41.2 mmHg TR Vmax:        321.00 cm/s  SHUNTS Systemic VTI:  0.10 m Systemic Diam: 2.30 cm Laurance Flatten MD Electronically signed by Laurance Flatten MD Signature Date/Time: 02/15/2020/12:24:57 PM    Final    DG Hip Unilat W or Wo Pelvis 2-3 Views Left  Result Date: 02/06/2020 CLINICAL DATA:  Status post fall. EXAM: DG HIP (WITH OR WITHOUT PELVIS) 2-3V LEFT COMPARISON:  None. FINDINGS: There is fracture of the left femoral neck. Patient status post prior fixation of proximal right femur. No other acute fracture or dislocation is identified. IMPRESSION: Fracture of left femoral neck. Electronically Signed   By: Sherian Rein M.D.   On: 02/10/2020 13:48        Scheduled Meds: . acetaminophen  1,000 mg Oral BID  . carvedilol  6.25 mg Oral BID WC  . docusate sodium  100 mg Oral BID  . folic acid  1 mg Oral Daily  . latanoprost  1 drop Both Eyes QHS  . multivitamin with minerals  1 tablet Oral Daily  . pravastatin  80 mg Oral QPM  . sodium zirconium cyclosilicate  10 g Oral Once   Continuous Infusions: . sodium chloride 75 mL/hr at 2020/02/28 0418  . heparin 1,000 Units/hr (2020-02-28 0214)     LOS: 2 days     Jacquelin Hawking, MD [facility] Feb 28, 2020, 9:51 AM  If 7PM-7AM, please contact  night-coverage [email or web address]

## 2020-02-23 NOTE — Progress Notes (Signed)
Bilateral lower extremity venous duplex has been completed. Preliminary results can be found in CV Proc through chart review.  Results were given to the patient's nurse, Delice Bison.  02/22/2020 12:17 PM Olen Cordial RVT

## 2020-02-23 NOTE — Progress Notes (Signed)
Called daughter, Zella Ball, and updated on plan of care. Nino Parsley

## 2020-02-23 NOTE — Progress Notes (Signed)
Vascular report of +DVT Left leg reported to MD. Nino Parsley

## 2020-02-23 NOTE — Progress Notes (Signed)
ANTICOAGULATION CONSULT NOTE   Pharmacy Consult for heparin infusion Indication: pulmonary embolus  No Known Allergies  Patient Measurements: Weight: 70.3 kg  Vital Signs: Temp: 97.9 F (36.6 C) (01/24 0548) BP: 138/58 (01/24 0548) Pulse Rate: 77 (01/24 0548)  Labs: Recent Labs    02/02/2020 1338 02/29/2020 1631 02/27/2020 0011 02/08/2020 0124 02/25/2020 0852 02/15/2020 1258 02/12/2020 0005 02/20/2020 1111  HGB 8.8*  --  8.2*  --   --   --  8.8*  --   HCT 28.2*  --  27.1*  --   --   --  28.9*  --   PLT 173  --  143*  --   --   --  147*  --   APTT  --   --   --   --   --  39*  --   --   HEPARINUNFRC  --   --   --   --   --   --  1.02* 0.70  CREATININE 1.93*  --  2.12*  --   --   --  2.07*  --   TROPONINIHS 29*   < >  --  172* 294* 266*  --   --    < > = values in this interval not displayed.    Estimated Creatinine Clearance: 21.6 mL/min (A) (by C-G formula based on SCr of 2.07 mg/dL (H)).   Medical History: Past Medical History:  Diagnosis Date  . Chronic kidney disease, stage 3b (HCC) 01/21/2020  . Coronary artery disease   . Hypertension     Medications:  No PTA anticoagulation  Assessment: Pharmacy consulted to dose heparin infusion for possible PE with elevated d-dimer and RV dysfunction.  Patient received enoxaparin 40 mg subq once on 02/15/2020 1643 and once on 02/14/2020 0941.  02/02/2020  Heparin level = 0.70 , therapeutic but on higher end   Hgb 7.6, PLTC 149  No complications of therapy noted  Goal of Therapy:  Heparin level 0.3-0.7 units/ml Monitor platelets by anticoagulation protocol: Yes   Plan:   Decrease heparin drip to 950 units/hr   Obtain HL 8 hours after rate change  Check heparin level and CBC daily while on heparin  Monitor for signs and symptoms of bleeding    Adalberto Cole, PharmD, BCPS 02/07/2020 12:05 PM

## 2020-02-23 NOTE — Consult Note (Signed)
Palliative Care Consult Note  Mr. Jacob [name] is a 87 year old gentleman with coronary artery disease, stage III chronic kidney disease, recent right-sided hip fracture and failure to thrive who is admitted after suffering a new left-sided hip fracture.  There were was a tentative plan for surgical intervention tomorrow however this has been complicated by atrial fibrillation, deep vein thrombosis and high suspicion for pulmonary embolism.   Palliative care consult was requested for goals of care discussion and possible hospice options.  I have seen and evaluated Jacob [name].  He is very lethargic but does awaken to voice, he is very hard of hearing, he does not complain of pain however per nursing with any turning or moving he does show nonverbal signs of pain.  He appears very pale and in a frail condition. He has been lethargic and mostly withdrawn throughout the day.  He is not eating much.  Concerns for aspiration and dysphagia.  Otherwise he is in no acute distress.  I spoke with his my wife Jacob [name] by phone.  She reports that they do not desire to pursue operative intervention for his hip fracture-I support this is the medically appropriate and reasonable way to proceed given his multiple risk factors and current state of health and quality of life. His main goal is to be at home for the time he has left. They understand that he may be approaching end of life. Comfort and dignity are major goals. Jacob [name] reports their daughter is an MA and lives with them and has been caring for her dad.   Recommendations:  1. Home with [facility]osp[facility] 1/25-they live in [facility] and request [facility] [place]. Will need to admit to service day of discharge/ They will need DME including a hospital bed. I have placed a TOC Order.  2. Comfort Measures. No Surgery. Roxanol for pain. Scheduled Tylenol. Treat other symptoms as needed. Comfort feeding. No additional diagnostics or medical interventions other than those that  will contribute to his comfort and keep him stable enough to go home tomorrow.  3. Med Rec done and orders placed to reflect the the goals of care.  4. Provide reassurance and support through this process.  Anderson Malta, DO Palliative Medicine  Time: 30 minutes Greater than 50%  of this time was spent counseling and coordinating care related to the above assessment and plan.

## 2020-02-23 NOTE — Progress Notes (Signed)
   Subjective: Patient is resting in bed. Reports pain in the left hip is controlled.  Potassium 6, hemoglobin 8.8 this AM.  Objective: Vital signs in last 24 hours: Temp:  [97.5 F (36.4 C)-97.9 F (36.6 C)] 97.9 F (36.6 C) (01/24 0548) Pulse Rate:  [77-87] 77 (01/24 0548) Resp:  [15-18] 18 (01/24 0548) BP: (101-138)/(58-91) 138/58 (01/24 0548) SpO2:  [93 %-99 %] 93 % (01/24 0548) Weight:  [70.3 kg-70.8 kg] 70.3 kg (01/23 1500)  Intake/Output from previous day:  Intake/Output Summary (Last 24 hours) at 02/29/2020 0750 Last data filed at 02/12/2020 0525 Gross per 24 hour  Intake 2519.09 ml  Output 300 ml  Net 2219.09 ml    Labs: Recent Labs    March 14, 2020 1338 02/11/2020 0011 02/08/2020 0005  HGB 8.8* 8.2* 8.8*   Recent Labs    02/15/2020 0011 02/11/2020 0005  WBC 12.1* 12.0*  RBC 2.44* 2.73*  HCT 27.1* 28.9*  PLT 143* 147*   Recent Labs    02/11/2020 0011 02/18/2020 0005  NA 139 138  K 5.2* 6.0*  CL 108 108  CO2 25 19*  BUN 65* 88*  CREATININE 2.12* 2.07*  GLUCOSE 136* 132*  CALCIUM 8.1* 7.8*   Exam: General - Patient is Alert Extremity -  Left lower extremity slightly shortened and externally rotated.  Distal pulses intact Sensation intact Pain with any attempted movement of the left hip  Motor Function - intact, moving foot and toes well on exam.   Past Medical History:  Diagnosis Date  . Chronic kidney disease, stage 3b (HCC) 01/21/2020  . Coronary artery disease   . Hypertension     Assessment/Plan: 1 Day Post-Op Procedure(s) (LRB): TOTAL HIP ARTHROPLASTY ANTERIOR APPROACH (Left) Principal Problem:   Fracture of femoral neck, left (HCC) Active Problems:   S/P right hip fracture   Essential hypertension   Chronic kidney disease, stage 3b (HCC)   Coronary artery disease involving coronary bypass graft of native heart without angina pectoris   Closed left hip fracture (HCC)   New onset a-fib (HCC)  Estimated body mass index is 23.57 kg/m as  calculated from the following:   Height as of this encounter: 5\' 8"  (1.727 m).   Weight as of this encounter: 70.3 kg.  DVT Prophylaxis - Heparin  NWB  Cardiology recommending V/Q scan prior to surgery. Given this and that his hemoglobin is 8.8, would be in patient's best interest to wait until tomorrow for surgery. Dr. plans to speak with Dr. Lequita Halt regarding the case, will determine a definitive plan pending cardiology clearance.   Resume thin consistency diet.  Charlann Boxer, PA-C Orthopedic Surgery [phone] 02/10/2020, 7:50 AM

## 2020-02-23 NOTE — Progress Notes (Signed)
I have reviewed the notes of today and see that Jacob [name] will be entering palliative care/hospice and will potentially be discharged home tomorrow. I agree that his risks of a poor outcome from surgical intervention far outweigh the potential benefits he may gain from surgery. We will sign off at this time. Please call if anything changes.

## 2020-02-23 NOTE — Progress Notes (Addendum)
Progress Note  Patient Name: Jacob [name] Date of Encounter: 02/26/2020  [facility] Cardiologist: Dr. Tollie Pizza [facility])  Subjective   Appears pale. Sleeping with mouth agape. Awakens but not very engaged in the conversation. No complaints of chest pain or SOB.   Inpatient Medications    Scheduled Meds: . acetaminophen  1,000 mg Oral BID  . carvedilol  6.25 mg Oral BID WC  . docusate sodium  100 mg Oral BID  . folic acid  1 mg Oral Daily  . latanoprost  1 drop Both Eyes QHS  . multivitamin with minerals  1 tablet Oral Daily  . pravastatin  80 mg Oral QPM   Continuous Infusions: . sodium chloride 75 mL/hr at 02/17/2020 0418  . heparin 1,000 Units/hr (02/19/2020 0214)   PRN Meds: alum & mag hydroxide-simeth, HYDROcodone-acetaminophen, morphine injection   Vital Signs    Vitals:   02/19/2020 1715 02/16/2020 1742 02/20/2020 2025 02/25/2020 0548  BP: (!) 109/58 107/89 112/61 (!) 138/58  Pulse: 78 82 80 77  Resp: 16 15 18 18   Temp: 97.7 F (36.5 C) 97.7 F (36.5 C) 97.7 F (36.5 C) 97.9 F (36.6 C)  TempSrc: Oral Axillary Axillary   SpO2: 98% 96% 99% 93%  Weight:      Height:        Intake/Output Summary (Last 24 hours) at 02/02/2020 1117 Last data filed at 02/20/2020 02/25/2020 Gross per 24 hour  Intake 2494.63 ml  Output 300 ml  Net 2194.63 ml   Last 3 Weights 02/19/2020 02/15/2020 01/29/2020  Weight (lbs) 154 lb 15.7 oz 156 lb 1.4 oz 156 lb  Weight (kg) 70.3 kg 70.8 kg 70.761 kg      Telemetry    Atrial fibrillation with occasional PVCs  - Personally Reviewed  ECG    No new tracings - Personally Reviewed  Physical Exam   GEN: Elderly gentleman laying in bed in NAD.   Neck: No JVD, bruising noted to left neck posterior to ear.  Cardiac: RRR, no murmurs, rubs, or gallops.  Respiratory: Clear to auscultation bilaterally. GI: Soft, nontender, non-distended  MS: No edema; No deformity. Neuro:  Nonfocal  Psych: Normal affect   Labs    High Sensitivity  Troponin:   Recent Labs  Lab 02/26/2020 1631 02/05/2020 0005 02/18/2020 0124 02/18/2020 0852 02/07/2020 1258  TROPONINIHS 38* 127* 172* 294* 266*      Chemistry Recent Labs  Lab 02/02/2020 1338 02/09/2020 0011 02/06/2020 0005  NA 137 139 138  K 4.6 5.2* 6.0*  CL 105 108 108  CO2 23 25 19*  GLUCOSE 166* 136* 132*  BUN 63* 65* 88*  CREATININE 1.93* 2.12* 2.07*  CALCIUM 8.3* 8.1* 7.8*  PROT 5.4*  --   --   ALBUMIN 2.9*  --   --   AST 16  --   --   ALT 16  --   --   ALKPHOS 141*  --   --   BILITOT 0.3  --   --   GFRNONAA 32* 28* 29*  ANIONGAP 9 6 11      Hematology Recent Labs  Lab 02/11/2020 1338 03/01/2020 0011 02/09/2020 0005  WBC 14.4* 12.1* 12.0*  RBC 2.57* 2.44* 2.73*  HGB 8.8* 8.2* 8.8*  HCT 28.2* 27.1* 28.9*  MCV 109.7* 111.1* 105.9*  MCH 34.2* 33.6 32.2  MCHC 31.2 30.3 30.4  RDW 15.9* 16.0* 19.4*  PLT 173 143* 147*    BNP Recent Labs  Lab 02/01/2020 0005  BNP 1,251.4*  DDimer  Recent Labs  Lab 02/01/2020 1258  DDIMER 4.81*     Radiology    CT ABDOMEN PELVIS WO CONTRAST  Result Date: 02/26/2020 CLINICAL DATA:  Fall, left hip pain, evaluate for retroperitoneal hematoma EXAM: CT ABDOMEN AND PELVIS WITHOUT CONTRAST TECHNIQUE: Multidetector CT imaging of the abdomen and pelvis was performed following the standard protocol without IV contrast. COMPARISON:  Right hip radiographs dated 01/12/2020. CT abdomen/pelvis dated 08/11/2013. FINDINGS: Lower chest: Trace bilateral pleural effusions. Hepatobiliary: Unenhanced liver is unremarkable. Gallbladder is unremarkable. No intrahepatic or extrahepatic ductal dilatation. Pancreas: Within normal limits. Spleen: Within normal limits. Adrenals/Urinary Tract: Adrenal glands are within normal limits. Bilateral renal cysts, including a 7.9 cm exophytic right lower pole renal cyst (series 3/image 38). No hydronephrosis. Bladder is underdistended but unremarkable. Stomach/Bowel: Stomach is within normal limits. No evidence of bowel  obstruction. Status post ileocecal resection with appendectomy. Sigmoid diverticulosis, without evidence of diverticulitis. Vascular/Lymphatic: Status post aorta bi-iliac stent repair of and abdominal aortic aneurysm. Maximal AP diameter is 5.2 cm, previously 4.9 cm in 2015, grossly unchanged. Atherosclerotic calcifications of the abdominal aorta and branch vessels. No suspicious abdominopelvic lymphadenopathy Reproductive: Prostate is grossly unremarkable. Other: No abdominopelvic ascites. Musculoskeletal: Acute subcapital left hip fracture with mild foreshortening and impaction (coronal image 58). Subacute intertrochanteric right hip fracture, s/p ORIF, with mild fragmentation anteriorly involving the greater trochanter. Mild degenerative changes of the visualized thoracolumbar spine. IMPRESSION: No evidence of retroperitoneal hematoma. Acute subcapital left hip fracture, as above. Subacute intertrochanteric right hip fracture status post ORIF, as above. Electronically Signed   By: Charline BillsSriyesh  Krishnan M.D.   On: 02/03/2020 14:02   DG Chest 1 View  Result Date: 02/07/2020 CLINICAL DATA:  Fall. EXAM: CHEST  1 VIEW COMPARISON:  01/10/2020 FINDINGS: Stable mild cardiac enlargement. Status post prior CABG. There is no evidence of pulmonary edema, consolidation, pneumothorax, nodule or pleural fluid. Visualized bony structures are unremarkable. IMPRESSION: Stable mild cardiac enlargement. No acute findings. Electronically Signed   By: Irish LackGlenn  Yamagata M.D.   On: 03/01/2020 13:51   CT Head Wo Contrast  Result Date: 02/10/2020 CLINICAL DATA:  Patient status post fall. EXAM: CT HEAD WITHOUT CONTRAST CT CERVICAL SPINE WITHOUT CONTRAST TECHNIQUE: Multidetector CT imaging of the head and cervical spine was performed following the standard protocol without intravenous contrast. Multiplanar CT image reconstructions of the cervical spine were also generated. COMPARISON:  Brain and C-spine CT 01/10/2020. FINDINGS: CT HEAD  FINDINGS Brain: Ventricles and sulci are appropriate for patient's age. Periventricular and subcortical white matter hypodensity compatible with chronic microvascular ischemic changes. No evidence for acute cortically based infarct, intracranial hemorrhage, mass lesion or mass-effect. Vascular: Unremarkable Skull: Intact. Sinuses/Orbits: Paranasal sinuses well aerated. Mastoid air cells unremarkable. Orbits unremarkable. Other: None CT CERVICAL SPINE FINDINGS Alignment: Mild grade 1 anterolisthesis C4 on C5. Otherwise normal anatomic alignment. Skull base and vertebrae: Degenerative changes.  Intact. Soft tissues and spinal canal: No prevertebral fluid or swelling. No visible canal hematoma. Disc levels:  Degenerative disc disease most pronounced C5-6. Upper chest: Unremarkable. Other: None. IMPRESSION: 1. No acute intracranial process. Atrophy and chronic microvascular ischemic changes. 2. No acute cervical spine fracture. Degenerative disc disease. Electronically Signed   By: Annia Beltrew  Davis M.D.   On: 03/01/2020 13:47   CT Cervical Spine Wo Contrast  Result Date: 02/03/2020 CLINICAL DATA:  Patient status post fall. EXAM: CT HEAD WITHOUT CONTRAST CT CERVICAL SPINE WITHOUT CONTRAST TECHNIQUE: Multidetector CT imaging of the head and cervical spine was performed following the  standard protocol without intravenous contrast. Multiplanar CT image reconstructions of the cervical spine were also generated. COMPARISON:  Brain and C-spine CT 01/10/2020. FINDINGS: CT HEAD FINDINGS Brain: Ventricles and sulci are appropriate for patient's age. Periventricular and subcortical white matter hypodensity compatible with chronic microvascular ischemic changes. No evidence for acute cortically based infarct, intracranial hemorrhage, mass lesion or mass-effect. Vascular: Unremarkable Skull: Intact. Sinuses/Orbits: Paranasal sinuses well aerated. Mastoid air cells unremarkable. Orbits unremarkable. Other: None CT CERVICAL SPINE  FINDINGS Alignment: Mild grade 1 anterolisthesis C4 on C5. Otherwise normal anatomic alignment. Skull base and vertebrae: Degenerative changes.  Intact. Soft tissues and spinal canal: No prevertebral fluid or swelling. No visible canal hematoma. Disc levels:  Degenerative disc disease most pronounced C5-6. Upper chest: Unremarkable. Other: None. IMPRESSION: 1. No acute intracranial process. Atrophy and chronic microvascular ischemic changes. 2. No acute cervical spine fracture. Degenerative disc disease. Electronically Signed   By: Annia Belt M.D.   On: 02/19/2020 13:47   DG CHEST PORT 1 VIEW  Result Date: 02/03/2020 CLINICAL DATA:  Hypoxia. EXAM: PORTABLE CHEST 1 VIEW COMPARISON:  02/11/2020 FINDINGS: Sequelae of CABG are again identified. The cardiac silhouette is mildly to moderately enlarged. Lung volumes are low with mild left basilar opacity likely reflecting atelectasis. No edema, sizable pleural effusion, or pneumothorax is identified with note made of partial obscuration of the lung apices by the patient's chin. No acute osseous abnormality is seen. IMPRESSION: Low lung volumes with left basilar atelectasis. Electronically Signed   By: Sebastian Ache M.D.   On: 02/28/2020 10:08   ECHOCARDIOGRAM COMPLETE  Result Date: 2020-03-21    ECHOCARDIOGRAM REPORT   Patient Name:   Jacob [name] Date of Exam: 03/21/20 Medical Rec #:  [id number]         Height:       70.0 in Accession #:    [id number]        Weight:       156.0 lb Date of Birth:  08-17-1924         BSA:          1.878 m Patient Age:    87 years          BP:           126/91 mmHg Patient Gender: M                 HR:           91 bpm. Exam Location:  Inpatient Procedure: 2D Echo and Intracardiac Opacification Agent Indications:    Elevated Troponin  History:        Patient has no prior history of Echocardiogram examinations.                 CAD, Prior CABG; Risk Factors:Hypertension.  Sonographer:    Thurman Coyer RDCS (AE) Referring Phys:  [phone] RALPH A NETTEY IMPRESSIONS  1. Limited acoutic windows with incomplete visualization of the LV endocardium.  2. Based on limited views, left ventricular ejection fraction, by estimation, is 40 to 45%. The left ventricle has mildly decreased function.  3. There is severe hypokinesis of the basal-to-mid anterior and all septal LV walls. There is abnormal septal motion due to prior coronary bypass surgery.  4. Right ventricular systolic function is moderately reduced. The right ventricular size is moderately enlarged. There is moderately elevated pulmonary artery systolic pressure.  5. The RV apex appears hyperdynamic compared to the base. Unclear chronicity, however, in the right clinical context, this would  be concerning for possible pulmonary embolism. Clinical correlation advised.  6. Left atrial size was mildly dilated.  7. Right atrial size was severely dilated.  8. The mitral valve is grossly normal. Trivial mitral valve regurgitation.  9. The aortic valve is tricuspid. There is mild calcification of the aortic valve. There is mild thickening of the aortic valve. Aortic valve regurgitation is not visualized. Mild aortic valve sclerosis is present, with no evidence of aortic valve stenosis. 10. The inferior vena cava is dilated in size with <50% respiratory variability, suggesting right atrial pressure of 15 mmHg. 11. Consider work-up for pulmonary embolism if there is clinical suspicion given moderately enlarged RV with moderate systolic dyfunction and apical apical hyperkinesis compared to the RV base. Comparison(s): No prior Echocardiogram. FINDINGS  Left Ventricle: Limited acoutic windows with incomplete visualization of the LV endocardium. Based on limited views, left ventricular ejection fraction, by estimation, is 40 to 45%. The left ventricle has mildly decreased function. The left ventricle demonstrates regional wall motion abnormalities. There is severe hypokinesis of the basal-to-mid anterior and  basal-to-apical septal walls. The left ventricular internal cavity size was normal in size. There is no left ventricular hypertrophy. Abnormal (paradoxical) septal motion consistent with post-operative status. Left ventricular diastolic parameters are indeterminate. Right Ventricle: The right ventricular size is moderately enlarged. No increase in right ventricular wall thickness. Right ventricular systolic function is moderately reduced. There is moderately elevated pulmonary artery systolic pressure. The tricuspid  regurgitant velocity is 3.21 m/s, and with an assumed right atrial pressure of 8 mmHg, the estimated right ventricular systolic pressure is 49.2 mmHg. Left Atrium: Left atrial size was mildly dilated. Right Atrium: Right atrial size was severely dilated. Pericardium: There is no evidence of pericardial effusion. Mitral Valve: The mitral valve is grossly normal. There is mild thickening of the mitral valve leaflet(s). Trivial mitral valve regurgitation. Tricuspid Valve: The tricuspid valve is normal in structure. Tricuspid valve regurgitation is mild. Aortic Valve: The aortic valve is tricuspid. There is mild calcification of the aortic valve. There is mild thickening of the aortic valve. Aortic valve regurgitation is not visualized. Mild aortic valve sclerosis is present, with no evidence of aortic valve stenosis. Pulmonic Valve: The pulmonic valve was normal in structure. Pulmonic valve regurgitation is trivial. Aorta: The aortic root is normal in size and structure. Venous: The inferior vena cava is dilated in size with less than 50% respiratory variability, suggesting right atrial pressure of 15 mmHg. IAS/Shunts: No atrial level shunt detected by color flow Doppler.  LEFT VENTRICLE PLAX 2D LVIDd:         4.50 cm LVIDs:         3.50 cm LV PW:         1.00 cm LV IVS:        1.00 cm LVOT diam:     2.30 cm LV SV:         40 LV SV Index:   21 LVOT Area:     4.15 cm  LEFT ATRIUM             Index        RIGHT ATRIUM           Index LA diam:        4.80 cm 2.56 cm/m  RA Area:     26.90 cm LA Vol (A2C):   85.0 ml 45.26 ml/m RA Volume:   85.50 ml  45.52 ml/m LA Vol (A4C):   52.9 ml 28.17 ml/m  LA Biplane Vol: 71.4 ml 38.02 ml/m  AORTIC VALVE LVOT Vmax:   60.10 cm/s LVOT Vmean:  36.700 cm/s LVOT VTI:    0.096 m  AORTA Ao Root diam: 3.30 cm TRICUSPID VALVE TR Peak grad:   41.2 mmHg TR Vmax:        321.00 cm/s  SHUNTS Systemic VTI:  0.10 m Systemic Diam: 2.30 cm Laurance FlattenHeather Pemberton MD Electronically signed by Laurance FlattenHeather Pemberton MD Signature Date/Time: 02/18/2020/12:24:57 PM    Final    DG Hip Unilat W or Wo Pelvis 2-3 Views Left  Result Date: 2020-04-24 CLINICAL DATA:  Status post fall. EXAM: DG HIP (WITH OR WITHOUT PELVIS) 2-3V LEFT COMPARISON:  None. FINDINGS: There is fracture of the left femoral neck. Patient status post prior fixation of proximal right femur. No other acute fracture or dislocation is identified. IMPRESSION: Fracture of left femoral neck. Electronically Signed   By: Sherian ReinWei-Chen  Lin M.D.   On: 02022-03-26 13:48    Cardiac Studies   Echocardiogram 02/28/2020: 1. Limited acoutic windows with incomplete visualization of the LV  endocardium.  2. Based on limited views, left ventricular ejection fraction, by  estimation, is 40 to 45%. The left ventricle has mildly decreased  function.  3. There is severe hypokinesis of the basal-to-mid anterior and all  septal LV walls. There is abnormal septal motion due to prior coronary  bypass surgery.  4. Right ventricular systolic function is moderately reduced. The right  ventricular size is moderately enlarged. There is moderately elevated  pulmonary artery systolic pressure.  5. The RV apex appears hyperdynamic compared to the base. Unclear  chronicity, however, in the right clinical context, this would be  concerning for possible pulmonary embolism. Clinical correlation advised.  6. Left atrial size was mildly dilated.  7. Right atrial  size was severely dilated.  8. The mitral valve is grossly normal. Trivial mitral valve  regurgitation.  9. The aortic valve is tricuspid. There is mild calcification of the  aortic valve. There is mild thickening of the aortic valve. Aortic valve  regurgitation is not visualized. Mild aortic valve sclerosis is present,  with no evidence of aortic valve  stenosis.  10. The inferior vena cava is dilated in size with <50% respiratory  variability, suggesting right atrial pressure of 15 mmHg.  11. Consider work-up for pulmonary embolism if there is clinical suspicion  given moderately enlarged RV with moderate systolic dyfunction and apical  apical hyperkinesis compared to the RV base.   Patient Profile     87 y.o. male with history of CAD s/p prior CABG (06 LIMA to Lad, SVG to OM1 Aorta to PDA and RPL; patent grafts 2016),  AAA s/p repair, HTN, HLD with CKD baseline creatine 1.91, who presented after a fall with acute left hip fracture, who is being followed by cardiology for preoperative evaluation in patient with elevated trop, new LV/RV dysfunction, and new AF  Assessment & Plan    1. Preoperative evaluation: patient presented s/p fall and found to have acute left hip fracture. Ortho consulted and planning to take to the OR this admission. He had no complaints of chest pain. He was found to be in new onset afib on arrival. Echo revealed EF 40-45%, severe hypokinesis of the basal-mid anterior and septal walls likely 2/2 prior CABG, moderately reduced RV systolic function, hyperdynamic RV base c/f possible PE, mild LAE, severely dilated RA, and no significant valvular abnormalities. Due to underlying CKD, a VQ scan was performed today which revealed  a LLL PE. LLE Doppler also positive for DVT. Per Dr. Izora Ribas, patient has a Revised Cardiac Risk Index = 3 = 11%; high risk estimated risk of perioperative myocardial infarction, pulmonary edema, ventricular fibrillation, cardiac arrest, or  complete heart block.  - Do not anticipate further cardiac work-up prior to surgery.   - Continue heparin gtt for now   2. Acute combined CHF: Echo with EF 40-45% and moderate RV systolic dysfunction. Suspect LV dysfunction could be 2/2 Afib vs ischemic etiology. He has no complaints of chest pain. HsTrop [phone] - trend not c/w ACS. BNP elevated to 1251. He had 1+ LE edema on exam today.  - Will give a dose of IV lasix 20mg  today and monitor for response - Continue carvedilol - Continue to monitor strict I&Os and daily weights - Continue to monitor daily weights  3. CAD s/p CABG: no clear anginal complaints. EKG non-ischemic. HsTrop peaked at 294 and trended down. EF 40-45% on echo this admission.  - Do not anticipate further ischemic evaluation this admission - Continue BBlocker - Continue statin  4. New onset atrial fibrillation: continues to be in Afib with rates in the 60s-80s. Currently on heparin gtt for stroke ppx given CHADSVASC=5. Anticipating at least a 3 month course of anticoagulation for management of PE/DVT, however risk/benefits may need to be addressed going forward given fall risk - Continue carvedilol for rate control - Continue heparin gtt for stroke ppx in the perioperative setting  5. VTE: patient found to have acute LLL PE and LLE DVT this admission. Currently on heparin in anticipation of possible hip surgery - Continue heparin gtt per primary team       For questions or updates, please contact [facility] Please consult [email or web address] for contact info under        Signed, , PA-C  2020/03/21, 11:17 AM    I have seen and examined the patient along with 02/25/2020, PA-C .  I have reviewed the chart, notes and new data.  I agree with PA/NP's note.  Key new complaints: denies dyspnea and chest pain (either angina or pleuritic). Second hip fracture in a few months Key examination changes: lethargic, appears very pale, JVP elevated,  irregular rhythm, but rate controlled, 1 + slightly asymmetrical lower extremity edema (L>R), very hard of hearing and sluggish reponses, but at least partially oriented. Key new findings / data: AFib is well rate controlled; evidence of single segmental wedge perfusion defect c/w PE and LLE DVT. Macrocytic anemia w normal B12, consider myelodysplasia, but also with evidence of iron deficiency, uncertain cause.  PLAN: Needs anticoagulation for at least 3 months for provoked DVT/PE, but I am not sure that he is a good candidate for long term anticoagulation for his atrial fibrillation. Adynamic and marginal elevation in hsTroponinI could be readily explained by demand ischemia due to his acute illness and pulmonary embolism. No angina or frank ischemic ECG changes. No plan for coronary evaluation. Inevitably high risk of CV complications due to his age, frailty and numerous comorbid conditions, but he did generally OK w previous hip surgery.  Risk is higher this time due to interval venous thromboembolism. Reasonable to pursue palliative care evaluation, as recent rapid downhill course of his QOL is undeniable.  Beatriz Stallion, MD, [facility] [phone] 2020-03-21, 3:31 PM

## 2020-02-24 ENCOUNTER — Encounter (HOSPITAL_COMMUNITY): Payer: Self-pay | Admitting: Internal Medicine

## 2020-02-24 DIAGNOSIS — Z515 Encounter for palliative care: Secondary | ICD-10-CM

## 2020-03-02 NOTE — Progress Notes (Signed)
Pt was found by attending nurse unresponsive. Assessed patient. No pulse(HR) or respirations per auscultation. Reassessed by second nurse. Second nurse assessed- same findings noted as attending nurse. Pt comfort care/DNR. Patient passed and death called at 0530. Made wife aware. Made on call attending aware Evonnie Dawes).

## 2020-03-02 NOTE — Death Summary Note (Signed)
DEATH SUMMARY   Patient Details  Name: Jacob [name] MRN: [id number] DOB: 12-Jan-1925  Admission/Discharge Information   Admit Date:  03/11/20  Date of Death: Date of Death: 03/14/2020  Time of Death: Time of Death: 0530  Length of Stay: 3  Referring Physician: Malka So., MD   Reason(s) for Hospitalization  Fall  Diagnoses  Preliminary cause of death:  Secondary Diagnoses (including complications and co-morbidities):  Principal Problem:   Left displaced femoral neck fracture (HCC) Active Problems:   S/P right hip fracture   Essential hypertension   Chronic kidney disease, stage 3b (HCC)   Coronary artery disease involving coronary bypass graft of native heart without angina pectoris   Closed left hip fracture (HCC)   New onset a-fib (HCC)   Pressure injury of skin   Single subsegmental pulmonary embolism without acute cor pulmonale (HCC)   DVT femoral (deep venous thrombosis) with thrombophlebitis, left [facility])   Hospice care patient   Comfort measures only status   Brief Hospital Course (including significant findings, care, treatment, and services provided and events leading to death)  Jacob [name] is a 87 y.o. year old male  with a history of CAD, CKD stage IIIb, hypertension, recent right hip fracture. Patient presented secondary to fall, suffering a left hip fracture. Plan for surgical repair complicated by concern for possible acute cardiac process. While admitted, he developed atrial fibrillation with elevated troponin. Transthoracic Echocardiogram was obtained which was significant for right ventricular heart strain concerning for acute PE. He was started on empiric heparin drip and Q scan was obtained secondary to inability to obtain CTA in setting of CKD. Q scan was intermediate for a PE and venous duplex was positive for an acute left lower extremity DVT. Goals of care discussions with palliative care took place on 1/24 with decision to make patient comfort  measures only. He died the next morning.    Pertinent Labs and Studies  Significant Diagnostic Studies CT ABDOMEN PELVIS WO CONTRAST  Result Date: 2020/03/11 CLINICAL DATA:  Fall, left hip pain, evaluate for retroperitoneal hematoma EXAM: CT ABDOMEN AND PELVIS WITHOUT CONTRAST TECHNIQUE: Multidetector CT imaging of the abdomen and pelvis was performed following the standard protocol without IV contrast. COMPARISON:  Right hip radiographs dated 01/12/2020. CT abdomen/pelvis dated 08/11/2013. FINDINGS: Lower chest: Trace bilateral pleural effusions. Hepatobiliary: Unenhanced liver is unremarkable. Gallbladder is unremarkable. No intrahepatic or extrahepatic ductal dilatation. Pancreas: Within normal limits. Spleen: Within normal limits. Adrenals/Urinary Tract: Adrenal glands are within normal limits. Bilateral renal cysts, including a 7.9 cm exophytic right lower pole renal cyst (series 3/image 38). No hydronephrosis. Bladder is underdistended but unremarkable. Stomach/Bowel: Stomach is within normal limits. No evidence of bowel obstruction. Status post ileocecal resection with appendectomy. Sigmoid diverticulosis, without evidence of diverticulitis. Vascular/Lymphatic: Status post aorta bi-iliac stent repair of and abdominal aortic aneurysm. Maximal AP diameter is 5.2 cm, previously 4.9 cm in 2015, grossly unchanged. Atherosclerotic calcifications of the abdominal aorta and branch vessels. No suspicious abdominopelvic lymphadenopathy Reproductive: Prostate is grossly unremarkable. Other: No abdominopelvic ascites. Musculoskeletal: Acute subcapital left hip fracture with mild foreshortening and impaction (coronal image 58). Subacute intertrochanteric right hip fracture, s/p ORIF, with mild fragmentation anteriorly involving the greater trochanter. Mild degenerative changes of the visualized thoracolumbar spine. IMPRESSION: No evidence of retroperitoneal hematoma. Acute subcapital left hip fracture, as above.  Subacute intertrochanteric right hip fracture status post ORIF, as above. Electronically Signed   By: Charline Bills M.D.   On: 03/11/2020 14:02   DG  Chest 1 View  Result Date: 02/17/2020 CLINICAL DATA:  Fall. EXAM: CHEST  1 VIEW COMPARISON:  01/10/2020 FINDINGS: Stable mild cardiac enlargement. Status post prior CABG. There is no evidence of pulmonary edema, consolidation, pneumothorax, nodule or pleural fluid. Visualized bony structures are unremarkable. IMPRESSION: Stable mild cardiac enlargement. No acute findings. Electronically Signed   By: Irish LackGlenn  Yamagata M.D.   On: 02/04/2020 13:51   CT Head Wo Contrast  Result Date: 02/06/2020 CLINICAL DATA:  Patient status post fall. EXAM: CT HEAD WITHOUT CONTRAST CT CERVICAL SPINE WITHOUT CONTRAST TECHNIQUE: Multidetector CT imaging of the head and cervical spine was performed following the standard protocol without intravenous contrast. Multiplanar CT image reconstructions of the cervical spine were also generated. COMPARISON:  Brain and C-spine CT 01/10/2020. FINDINGS: CT HEAD FINDINGS Brain: Ventricles and sulci are appropriate for patient's age. Periventricular and subcortical white matter hypodensity compatible with chronic microvascular ischemic changes. No evidence for acute cortically based infarct, intracranial hemorrhage, mass lesion or mass-effect. Vascular: Unremarkable Skull: Intact. Sinuses/Orbits: Paranasal sinuses well aerated. Mastoid air cells unremarkable. Orbits unremarkable. Other: None CT CERVICAL SPINE FINDINGS Alignment: Mild grade 1 anterolisthesis C4 on C5. Otherwise normal anatomic alignment. Skull base and vertebrae: Degenerative changes.  Intact. Soft tissues and spinal canal: No prevertebral fluid or swelling. No visible canal hematoma. Disc levels:  Degenerative disc disease most pronounced C5-6. Upper chest: Unremarkable. Other: None. IMPRESSION: 1. No acute intracranial process. Atrophy and chronic microvascular ischemic  changes. 2. No acute cervical spine fracture. Degenerative disc disease. Electronically Signed   By: Annia Beltrew  Davis M.D.   On: 02/28/2020 13:47   CT Cervical Spine Wo Contrast  Result Date: 02/29/2020 CLINICAL DATA:  Patient status post fall. EXAM: CT HEAD WITHOUT CONTRAST CT CERVICAL SPINE WITHOUT CONTRAST TECHNIQUE: Multidetector CT imaging of the head and cervical spine was performed following the standard protocol without intravenous contrast. Multiplanar CT image reconstructions of the cervical spine were also generated. COMPARISON:  Brain and C-spine CT 01/10/2020. FINDINGS: CT HEAD FINDINGS Brain: Ventricles and sulci are appropriate for patient's age. Periventricular and subcortical white matter hypodensity compatible with chronic microvascular ischemic changes. No evidence for acute cortically based infarct, intracranial hemorrhage, mass lesion or mass-effect. Vascular: Unremarkable Skull: Intact. Sinuses/Orbits: Paranasal sinuses well aerated. Mastoid air cells unremarkable. Orbits unremarkable. Other: None CT CERVICAL SPINE FINDINGS Alignment: Mild grade 1 anterolisthesis C4 on C5. Otherwise normal anatomic alignment. Skull base and vertebrae: Degenerative changes.  Intact. Soft tissues and spinal canal: No prevertebral fluid or swelling. No visible canal hematoma. Disc levels:  Degenerative disc disease most pronounced C5-6. Upper chest: Unremarkable. Other: None. IMPRESSION: 1. No acute intracranial process. Atrophy and chronic microvascular ischemic changes. 2. No acute cervical spine fracture. Degenerative disc disease. Electronically Signed   By: Annia Beltrew  Davis M.D.   On: 02/17/2020 13:47   NM Pulmonary Perf and Vent  Result Date: 04/26/20 CLINICAL DATA:  HIGH PROBABILITY FOR ACUTE PULMONARY EMBOLUS. NEW ONSET ATRIAL FIBRILLATION. ELEVATED D-DIMER. EXAM: NUCLEAR MEDICINE PERFUSION LUNG SCAN TECHNIQUE: Perfusion images were obtained in multiple projections after intravenous injection of  radiopharmaceutical. Ventilation scans intentionally deferred if perfusion scan and chest x-ray adequate for interpretation during COVID 19 epidemic. RADIOPHARMACEUTICALS:  4.4 mCi Tc-1315m MAA IV COMPARISON:  Chest radiograph from today FINDINGS: The chest radiograph from today shows no focal pulmonary opacities. On the perfusion portion of the exam there is a single large segmental perfusion defect arising within the lateral aspect of the left lower lung. IMPRESSION: 1. Single large segmental perfusion  defect is identified arising within the lateral left lower lobe. Findings are compatible with low to intermediate probability for acute pulmonary embolus. 2. These results will be called to the ordering clinician or representative by the Radiologist Assistant, and communication documented in the PACS or [employer]. Electronically Signed   By: Signa Kell M.D.   On: 23-Mar-2020 11:33   DG CHEST PORT 1 VIEW  Result Date: 03-23-2020 CLINICAL DATA:  Hypoxia. EXAM: PORTABLE CHEST 1 VIEW COMPARISON:  02/14/2020 FINDINGS: Sequelae of CABG are again identified. The cardiac silhouette is mildly to moderately enlarged. Lung volumes are low with mild left basilar opacity likely reflecting atelectasis. No edema, sizable pleural effusion, or pneumothorax is identified with note made of partial obscuration of the lung apices by the patient's chin. No acute osseous abnormality is seen. IMPRESSION: Low lung volumes with left basilar atelectasis. Electronically Signed   By: Sebastian Ache M.D.   On: Mar 23, 2020 10:08   ECHOCARDIOGRAM COMPLETE  Result Date: 02/18/2020    ECHOCARDIOGRAM REPORT   Patient Name:   Darrin Luis Date of Exam: 02/14/2020 Medical Rec #:  [id number]         Height:       70.0 in Accession #:    [id number]        Weight:       156.0 lb Date of Birth:  1925/01/28         BSA:          1.878 m Patient Age:    89 years          BP:           126/91 mmHg Patient Gender: M                 HR:            91 bpm. Exam Location:  Inpatient Procedure: 2D Echo and Intracardiac Opacification Agent Indications:    Elevated Troponin  History:        Patient has no prior history of Echocardiogram examinations.                 CAD, Prior CABG; Risk Factors:Hypertension.  Sonographer:    Thurman Coyer RDCS (AE) Referring Phys: [phone] Lacey Wallman A Usha Slager IMPRESSIONS  1. Limited acoutic windows with incomplete visualization of the LV endocardium.  2. Based on limited views, left ventricular ejection fraction, by estimation, is 40 to 45%. The left ventricle has mildly decreased function.  3. There is severe hypokinesis of the basal-to-mid anterior and all septal LV walls. There is abnormal septal motion due to prior coronary bypass surgery.  4. Right ventricular systolic function is moderately reduced. The right ventricular size is moderately enlarged. There is moderately elevated pulmonary artery systolic pressure.  5. The RV apex appears hyperdynamic compared to the base. Unclear chronicity, however, in the right clinical context, this would be concerning for possible pulmonary embolism. Clinical correlation advised.  6. Left atrial size was mildly dilated.  7. Right atrial size was severely dilated.  8. The mitral valve is grossly normal. Trivial mitral valve regurgitation.  9. The aortic valve is tricuspid. There is mild calcification of the aortic valve. There is mild thickening of the aortic valve. Aortic valve regurgitation is not visualized. Mild aortic valve sclerosis is present, with no evidence of aortic valve stenosis. 10. The inferior vena cava is dilated in size with <50% respiratory variability, suggesting right atrial pressure of 15 mmHg. 11. Consider work-up for pulmonary embolism  if there is clinical suspicion given moderately enlarged RV with moderate systolic dyfunction and apical apical hyperkinesis compared to the RV base. Comparison(s): No prior Echocardiogram. FINDINGS  Left Ventricle: Limited acoutic  windows with incomplete visualization of the LV endocardium. Based on limited views, left ventricular ejection fraction, by estimation, is 40 to 45%. The left ventricle has mildly decreased function. The left ventricle demonstrates regional wall motion abnormalities. There is severe hypokinesis of the basal-to-mid anterior and basal-to-apical septal walls. The left ventricular internal cavity size was normal in size. There is no left ventricular hypertrophy. Abnormal (paradoxical) septal motion consistent with post-operative status. Left ventricular diastolic parameters are indeterminate. Right Ventricle: The right ventricular size is moderately enlarged. No increase in right ventricular wall thickness. Right ventricular systolic function is moderately reduced. There is moderately elevated pulmonary artery systolic pressure. The tricuspid  regurgitant velocity is 3.21 m/s, and with an assumed right atrial pressure of 8 mmHg, the estimated right ventricular systolic pressure is 49.2 mmHg. Left Atrium: Left atrial size was mildly dilated. Right Atrium: Right atrial size was severely dilated. Pericardium: There is no evidence of pericardial effusion. Mitral Valve: The mitral valve is grossly normal. There is mild thickening of the mitral valve leaflet(s). Trivial mitral valve regurgitation. Tricuspid Valve: The tricuspid valve is normal in structure. Tricuspid valve regurgitation is mild. Aortic Valve: The aortic valve is tricuspid. There is mild calcification of the aortic valve. There is mild thickening of the aortic valve. Aortic valve regurgitation is not visualized. Mild aortic valve sclerosis is present, with no evidence of aortic valve stenosis. Pulmonic Valve: The pulmonic valve was normal in structure. Pulmonic valve regurgitation is trivial. Aorta: The aortic root is normal in size and structure. Venous: The inferior vena cava is dilated in size with less than 50% respiratory variability, suggesting right  atrial pressure of 15 mmHg. IAS/Shunts: No atrial level shunt detected by color flow Doppler.  LEFT VENTRICLE PLAX 2D LVIDd:         4.50 cm LVIDs:         3.50 cm LV PW:         1.00 cm LV IVS:        1.00 cm LVOT diam:     2.30 cm LV SV:         40 LV SV Index:   21 LVOT Area:     4.15 cm  LEFT ATRIUM             Index       RIGHT ATRIUM           Index LA diam:        4.80 cm 2.56 cm/m  RA Area:     26.90 cm LA Vol (A2C):   85.0 ml 45.26 ml/m RA Volume:   85.50 ml  45.52 ml/m LA Vol (A4C):   52.9 ml 28.17 ml/m LA Biplane Vol: 71.4 ml 38.02 ml/m  AORTIC VALVE LVOT Vmax:   60.10 cm/s LVOT Vmean:  36.700 cm/s LVOT VTI:    0.096 m  AORTA Ao Root diam: 3.30 cm TRICUSPID VALVE TR Peak grad:   41.2 mmHg TR Vmax:        321.00 cm/s  SHUNTS Systemic VTI:  0.10 m Systemic Diam: 2.30 cm Laurance Flatten MD Electronically signed by Laurance Flatten MD Signature Date/Time: 02/05/2020/12:24:57 PM    Final    DG Hip Unilat W or Wo Pelvis 2-3 Views Left  Result Date: 02/16/2020 CLINICAL DATA:  Status post  fall. EXAM: DG HIP (WITH OR WITHOUT PELVIS) 2-3V LEFT COMPARISON:  None. FINDINGS: There is fracture of the left femoral neck. Patient status post prior fixation of proximal right femur. No other acute fracture or dislocation is identified. IMPRESSION: Fracture of left femoral neck. Electronically Signed   By: Sherian Rein M.D.   On: 02/15/2020 13:48   VAS [place] LOWER EXTREMITY VENOUS (DVT)  Result Date: 02/04/2020  Lower Venous DVT Study Indications: Elevated Ddimer.  Risk Factors: Trauma. Limitations: Poor ultrasound/tissue interface and patient positioning, ambient light interference. Comparison Study: No prior studies. Performing Technologist: Chanda Busing RVT  Examination Guidelines: A complete evaluation includes B-mode imaging, spectral Doppler, color Doppler, and power Doppler as needed of all accessible portions of each vessel. Bilateral testing is considered an integral part of a complete examination.  Limited examinations for reoccurring indications may be performed as noted. The reflux portion of the exam is performed with the patient in reverse Trendelenburg.  +---------+---------------+---------+-----------+----------+--------------+ RIGHT    CompressibilityPhasicitySpontaneityPropertiesThrombus Aging +---------+---------------+---------+-----------+----------+--------------+ CFV      Full           Yes      Yes                                 +---------+---------------+---------+-----------+----------+--------------+ SFJ      Full                                                        +---------+---------------+---------+-----------+----------+--------------+ FV Prox  Full                                                        +---------+---------------+---------+-----------+----------+--------------+ FV Mid   Full                                                        +---------+---------------+---------+-----------+----------+--------------+ FV DistalFull                                                        +---------+---------------+---------+-----------+----------+--------------+ PFV      Full                                                        +---------+---------------+---------+-----------+----------+--------------+ POP      Full           Yes      Yes                                 +---------+---------------+---------+-----------+----------+--------------+ PTV      Full                                                        +---------+---------------+---------+-----------+----------+--------------+  PERO     Full                                                        +---------+---------------+---------+-----------+----------+--------------+   +---------+---------------+---------+-----------+----------+--------------+ LEFT     CompressibilityPhasicitySpontaneityPropertiesThrombus Aging  +---------+---------------+---------+-----------+----------+--------------+ CFV      Full           Yes      Yes                                 +---------+---------------+---------+-----------+----------+--------------+ SFJ      Full                                                        +---------+---------------+---------+-----------+----------+--------------+ FV Prox  Full                                                        +---------+---------------+---------+-----------+----------+--------------+ FV Mid   Full                                                        +---------+---------------+---------+-----------+----------+--------------+ FV Distal               Yes      Yes                                 +---------+---------------+---------+-----------+----------+--------------+ PFV      Full                                                        +---------+---------------+---------+-----------+----------+--------------+ POP      Full           Yes      Yes                                 +---------+---------------+---------+-----------+----------+--------------+ PTV      Full                                                        +---------+---------------+---------+-----------+----------+--------------+ PERO     Partial                                      Acute          +---------+---------------+---------+-----------+----------+--------------+  Summary: RIGHT: - There is no evidence of deep vein thrombosis in the lower extremity. However, portions of this examination were limited- see technologist comments above.  - No cystic structure found in the popliteal fossa.  LEFT: - Findings consistent with acute deep vein thrombosis involving the left peroneal veins. - No cystic structure found in the popliteal fossa.  *See table(s) above for measurements and observations. Electronically signed by Fabienne Brunsharles Fields MD on 02/14/2020 at 6:16:54 PM.     Final     Microbiology Recent Results (from the past 240 hour(s))  SARS Coronavirus 2 by RT PCR (hospital order[facility]) Nasopharyngeal Nasopharyngeal Swab     Status: None   Collection Time: 02/20/2020  2:28 PM   Specimen: Nasopharyngeal Swab  Result Value Ref Range Status   SARS Coronavirus 2 NEGATIVE NEGATIVE Final    Comment: (NOTE) SARS-CoV-2 target nucleic acids are NOT DETECTED.  The SARS-CoV-2 RNA is generally detectable in upper and lower respiratory specimens during the acute phase of infection. The lowest concentration of SARS-CoV-2 viral copies this assay can detect is 250 copies / mL. A negative result does not preclude SARS-CoV-2 infection and should not be used as the sole basis for treatment or other patient management decisions.  A negative result may occur with improper specimen collection / handling, submission of specimen other than nasopharyngeal swab, presence of viral mutation(s) within the areas targeted by this assay, and inadequate number of viral copies (<250 copies / mL). A negative result must be combined with clinical observations, patient history, and epidemiological information.  Fact Sheet for Patients:   BoilerBrush.com.cyhttps://www.fda.gov/media/136312/download  Fact Sheet for Healthcare Providers: https://pope.com/https://www.fda.gov/media/136313/download  This test is not yet approved or  cleared by the [place] FDA and has been authorized for detection and/or diagnosis of SARS-CoV-2 by FDA under an Emergency Use Authorization (EUA).  This EUA will remain in effect (meaning this test can be used) for the duration of the COVID-19 declaration under Section 564(b)(1) of the Act, 21 U.S.C. section 360bbb-3(b)(1), unless the authorization is terminated or revoked sooner.  Performed at [facility] [place], [address] [place], [place] [address]     Lab Basic Metabolic Panel: Recent Labs  Lab 02/07/2020 1338 02/29/2020 0011  02/20/2020 0005 02/18/2020 1219  NA 137 139 138  --   K 4.6 5.2* 6.0* 5.5*  CL 105 108 108  --   CO2 23 25 19*  --   GLUCOSE 166* 136* 132*  --   BUN 63* 65* 88*  --   CREATININE 1.93* 2.12* 2.07*  --   CALCIUM 8.3* 8.1* 7.8*  --    Liver Function Tests: Recent Labs  Lab 02/12/2020 1338  AST 16  ALT 16  ALKPHOS 141*  BILITOT 0.3  PROT 5.4*  ALBUMIN 2.9*   No results for input(s): LIPASE, AMYLASE in the last 168 hours. No results for input(s): AMMONIA in the last 168 hours. CBC: Recent Labs  Lab 02/06/2020 1338 02/10/2020 0011 02/03/2020 0005 02/07/2020 1219  WBC 14.4* 12.1* 12.0* 15.7*  NEUTROABS 8.7*  --   --   --   HGB 8.8* 8.2* 8.8* 7.6*  HCT 28.2* 27.1* 28.9* 25.0*  MCV 109.7* 111.1* 105.9* 106.4*  PLT 173 143* 147* 149*   Cardiac Enzymes: No results for input(s): CKTOTAL, CKMB, CKMBINDEX, TROPONINI in the last 168 hours. Sepsis Labs: Recent Labs  Lab 02/26/2020 1338 02/10/2020 0011 02/09/2020 0005 02/29/2020 1219  WBC 14.4* 12.1* 12.0* 15.7*  Procedures/Operations     Jacquelin Hawking 02/05/2020, 7:54 AM

## 2020-03-02 NOTE — Progress Notes (Addendum)
Made patient wife Corrie Dandy) aware of personal belonging- personal medication, bilaleral hearing aids, Charger cord, and glasses. Belongings placed in personnel belonging bag, labeled with his  Name. Wife to come pick up. Belonging placed in Conference room. Charge nurse is aware.

## 2020-03-02 DEATH — deceased

## 2021-09-16 IMAGING — CT CT CERVICAL SPINE W/O CM
2 series · 11 of 27 positions shown, 14 images · non-contrast
Comparison: Brain and C-spine CT 01/10/2020.

CLINICAL DATA: Patient status post fall.

EXAM:
CT HEAD WITHOUT CONTRAST
CT CERVICAL SPINE WITHOUT CONTRAST
TECHNIQUE: Multidetector CT imaging of the head and cervical spine was
performed following the standard protocol without intravenous
contrast. Multiplanar CT image reconstructions of the cervical spine
were also generated.

[Series 4: c_spine 2.0 i30s 3 · axial · 0.36mm/px · z∈[-288,-176]mm · 6 of 74 slices shown, 8 images]
[im 12/74  soft-tissue]
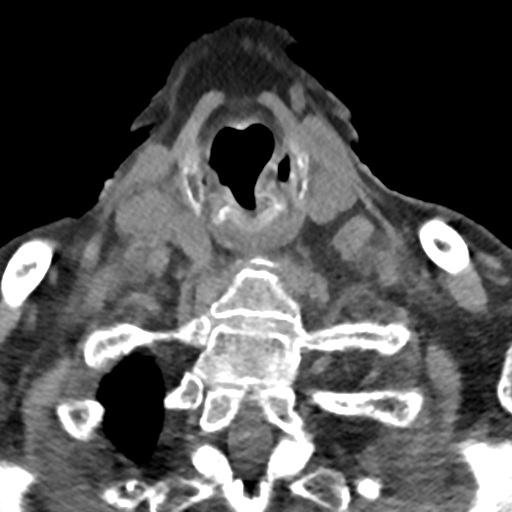
[im 12/74  bone]
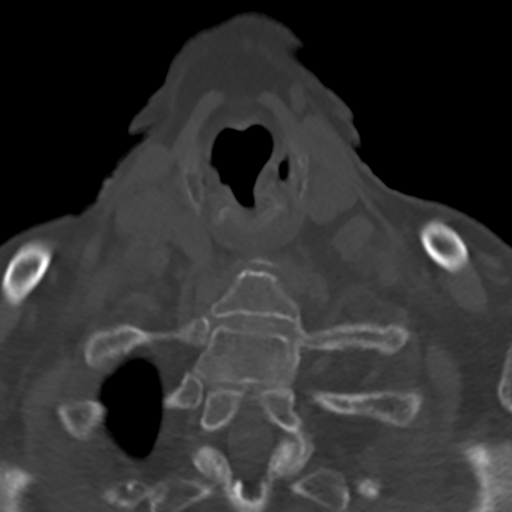
[im 23/74  bone]
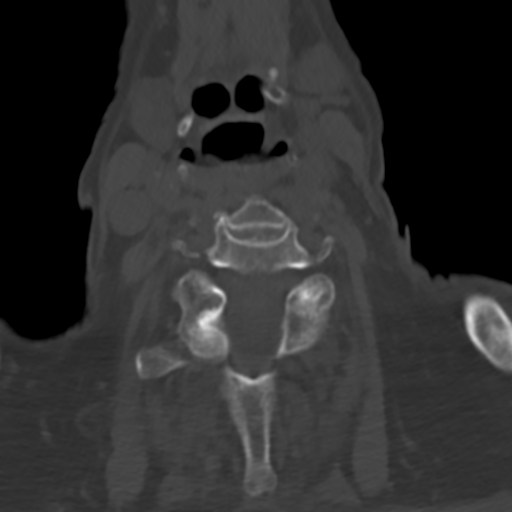
[im 34/74  bone]
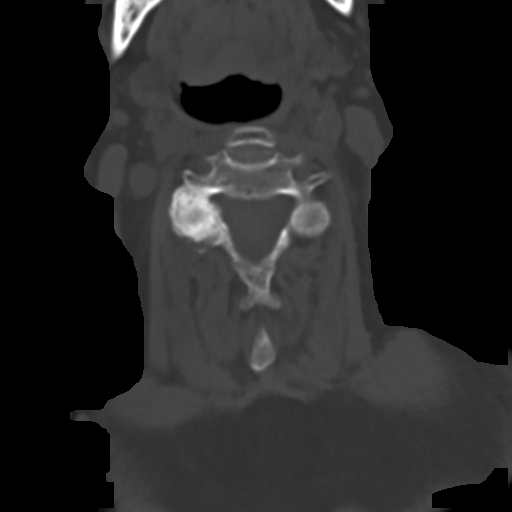
[im 45/74  bone]
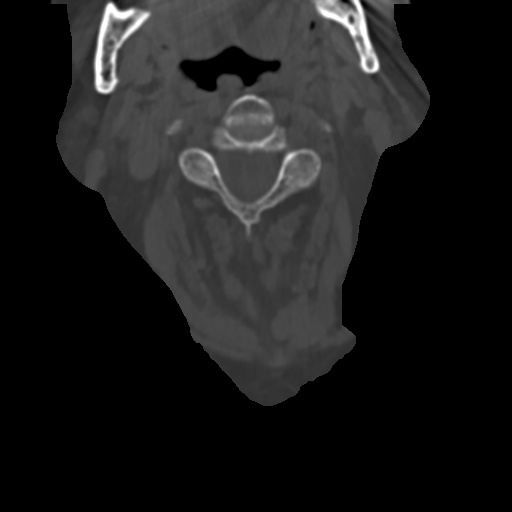
[im 57/74  soft-tissue]
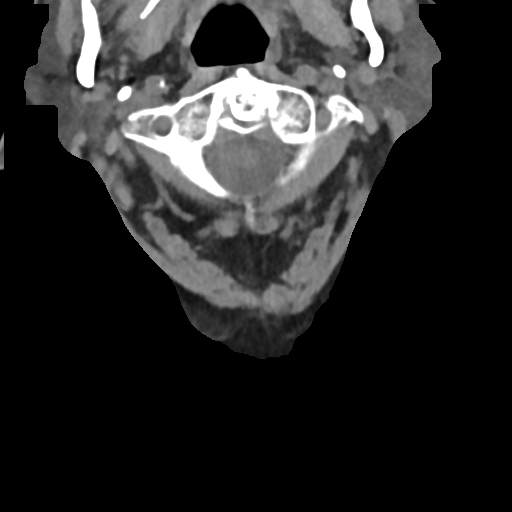
[im 57/74  bone]
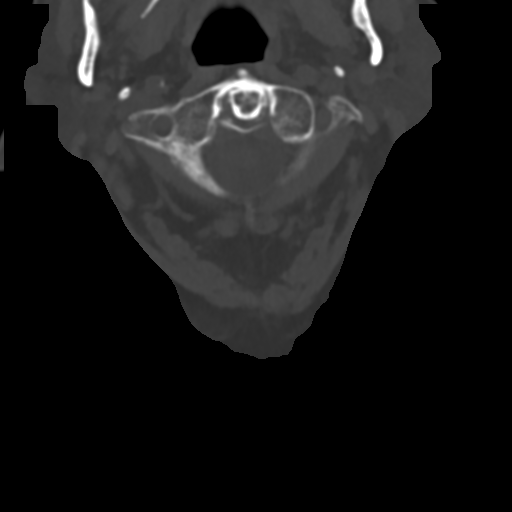
[im 68/74  bone]
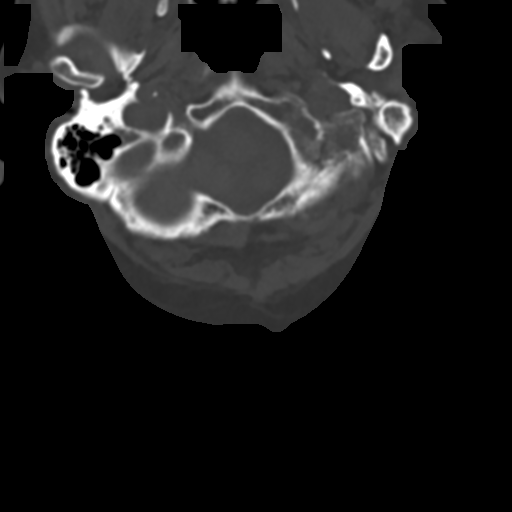

[Series 7: sagittals · sagittal · 0.31mm/px · 5 of 58 slices shown, 6 images]
[im 20/58  bone]
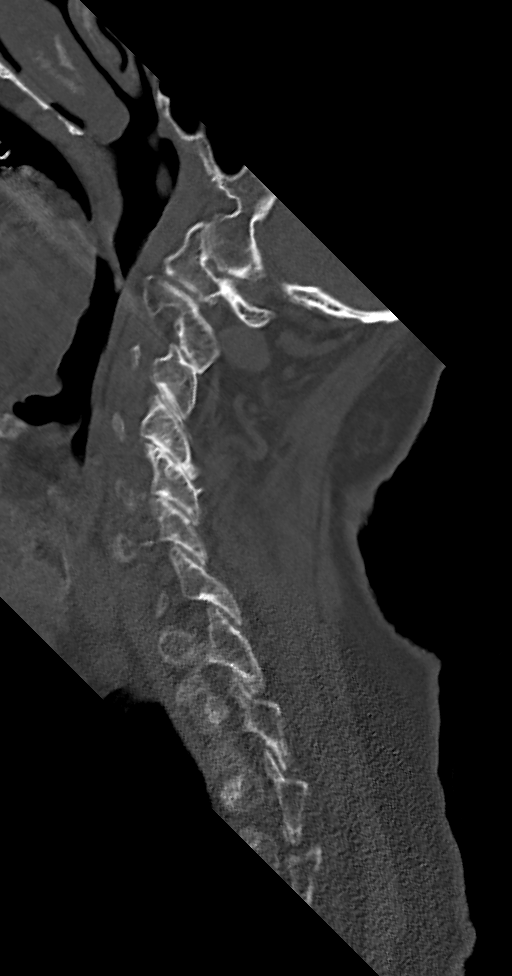
[im 24/58  bone]
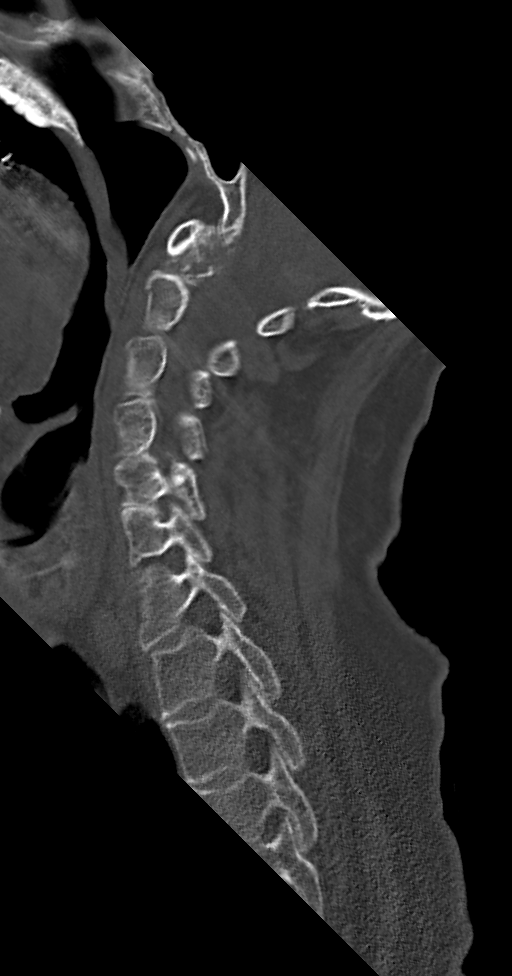
[im 29/58  soft-tissue]
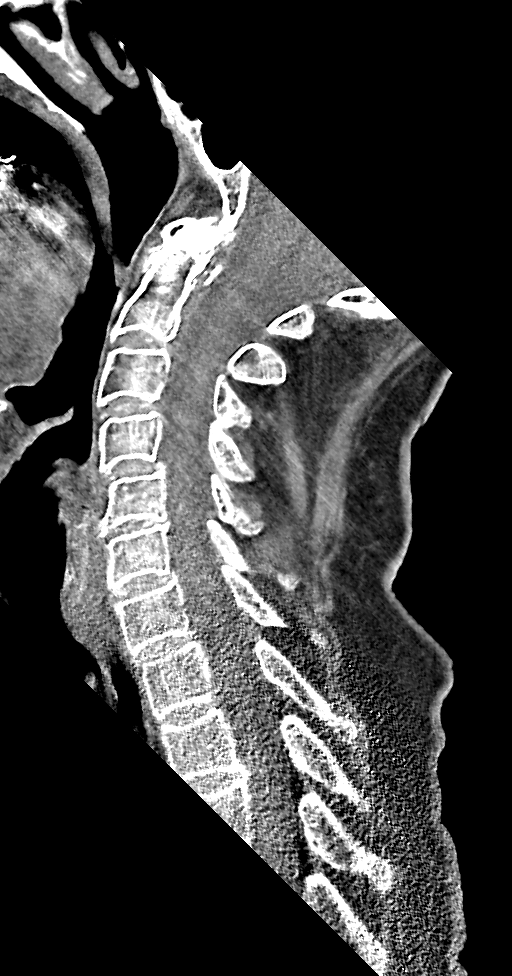
[im 29/58  bone]
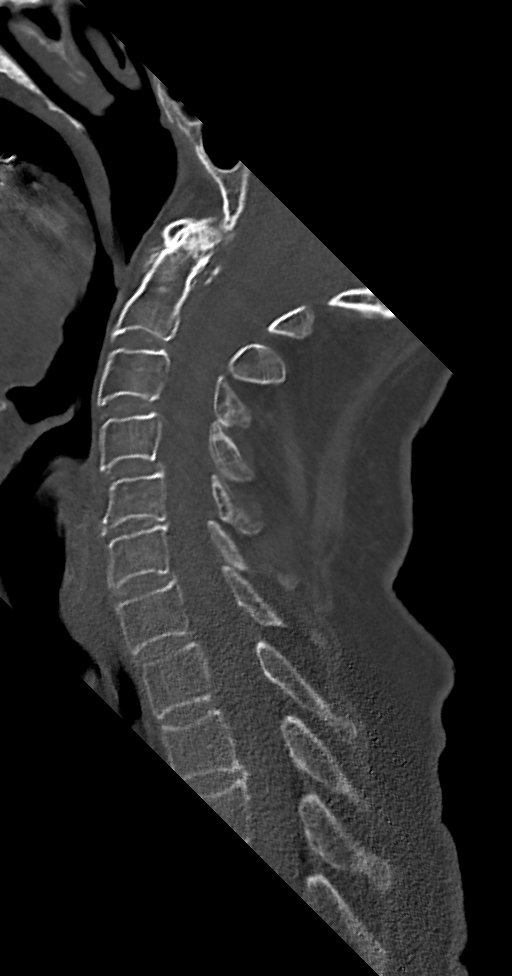
[im 34/58  bone]
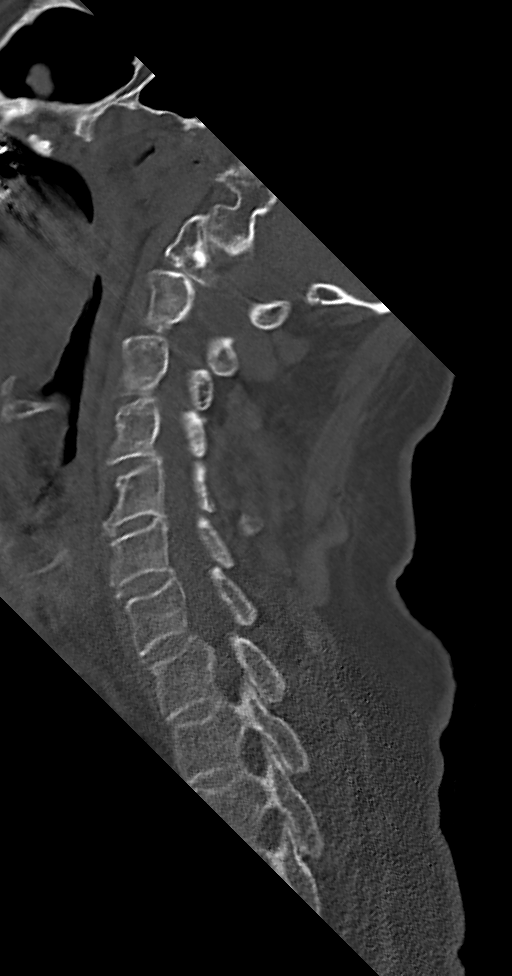
[im 39/58  bone]
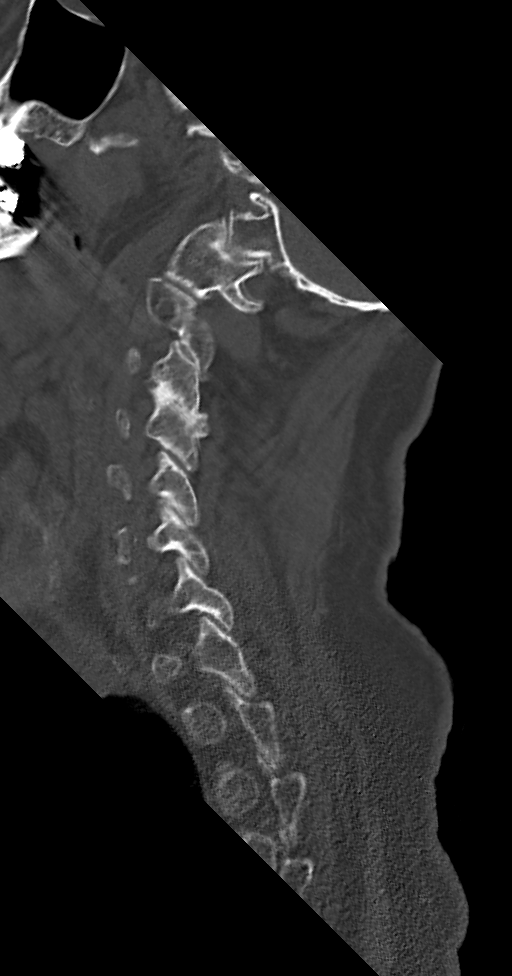

[11 of 27 positions shown; findings below may reference images not displayed]

FINDINGS: CT HEAD FINDINGS

Brain: Ventricles and sulci are appropriate for patient's age.
Periventricular and subcortical white matter hypodensity compatible
with chronic microvascular ischemic changes. No evidence for acute
cortically based infarct, intracranial hemorrhage, mass lesion or
mass-effect.

Vascular: Unremarkable

Skull: Intact.

Sinuses/Orbits: Paranasal sinuses well aerated. Mastoid air cells
unremarkable. Orbits unremarkable.

Other: None

CT CERVICAL SPINE FINDINGS

Alignment: Mild grade 1 anterolisthesis C4 on C5. Otherwise normal
anatomic alignment.

Skull base and vertebrae: Degenerative changes.  Intact.

Soft tissues and spinal canal: No prevertebral fluid or swelling. No
visible canal hematoma.

Disc levels:  Degenerative disc disease most pronounced C5-6.

Upper chest: Unremarkable.

Other: None.
IMPRESSION: 1. No acute intracranial process. Atrophy and chronic microvascular
ischemic changes.
2. No acute cervical spine fracture. Degenerative disc disease.

## 2021-09-16 IMAGING — CT CT HEAD W/O CM
3 series · 15 of 47 positions shown, 18 images · non-contrast
Comparison: Brain and C-spine CT 01/10/2020.

CLINICAL DATA: Patient status post fall.

EXAM:
CT HEAD WITHOUT CONTRAST
CT CERVICAL SPINE WITHOUT CONTRAST
TECHNIQUE: Multidetector CT imaging of the head and cervical spine was
performed following the standard protocol without intravenous
contrast. Multiplanar CT image reconstructions of the cervical spine
were also generated.

[Series 2: head 5.0 h30s · axial · 0.43mm/px · z∈[-179,-44]mm · 9 of 33 slices shown, 12 images]
[im 3/33  brain]
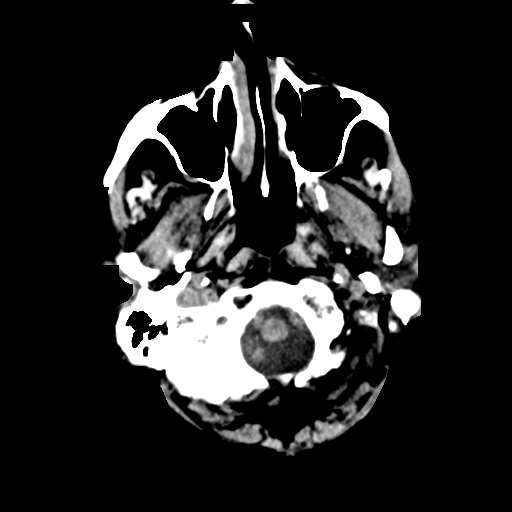
[im 3/33  bone]
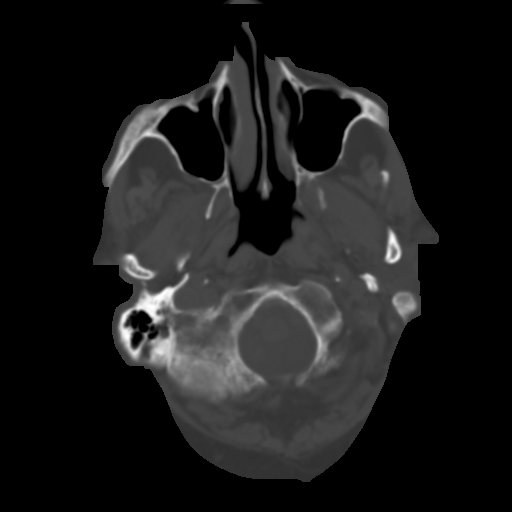
[im 6/33  brain]
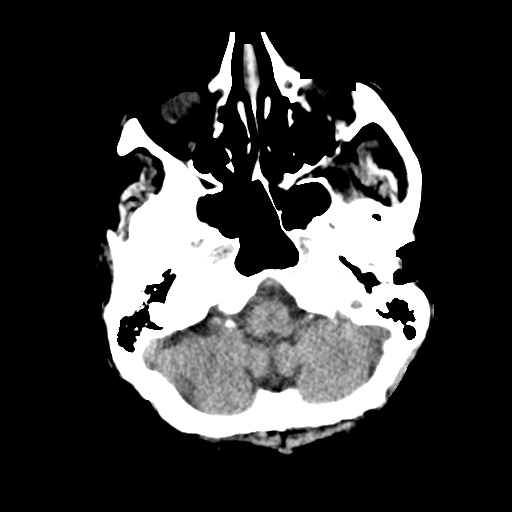
[im 9/33  brain]
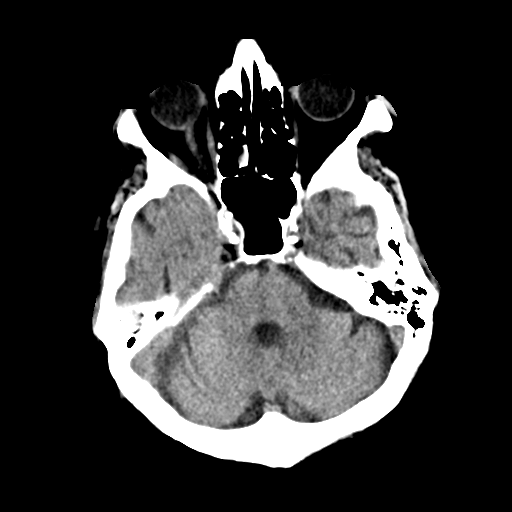
[im 13/33  brain]
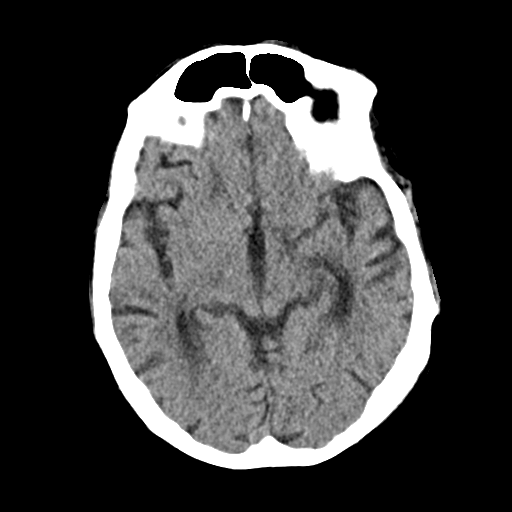
[im 17/33  brain]
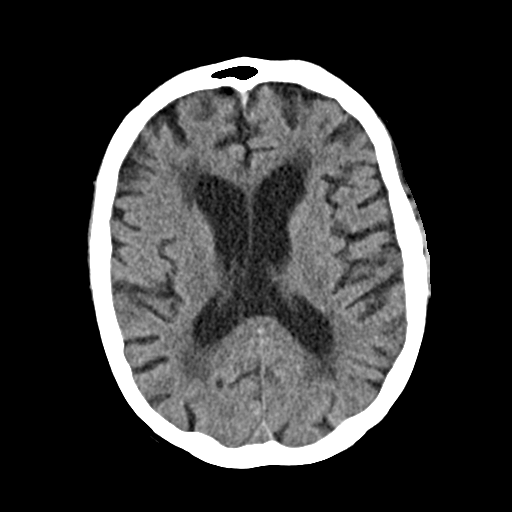
[im 17/33  bone]
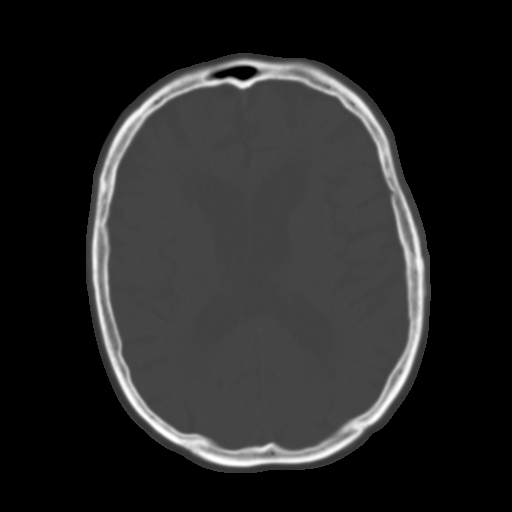
[im 20/33  brain]
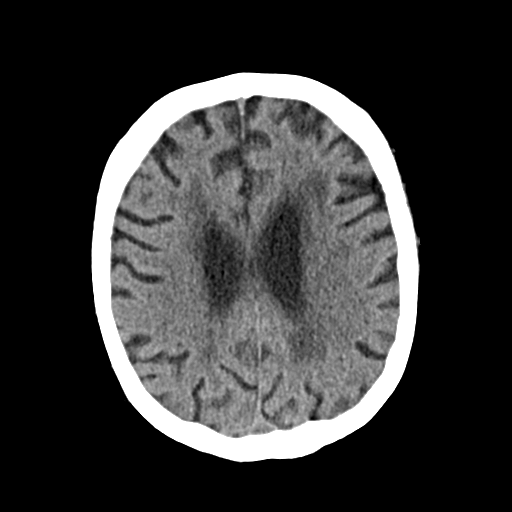
[im 24/33  brain]
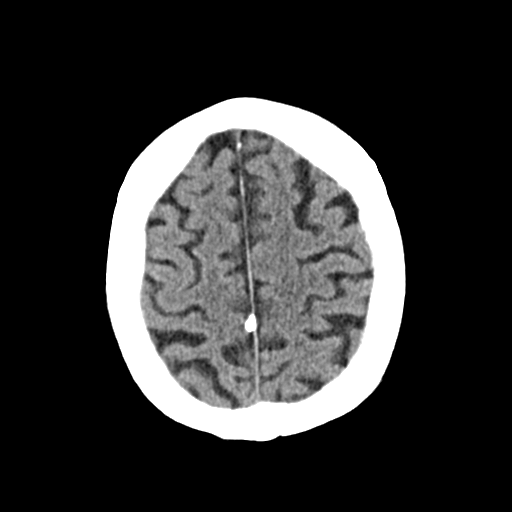
[im 27/33  brain]
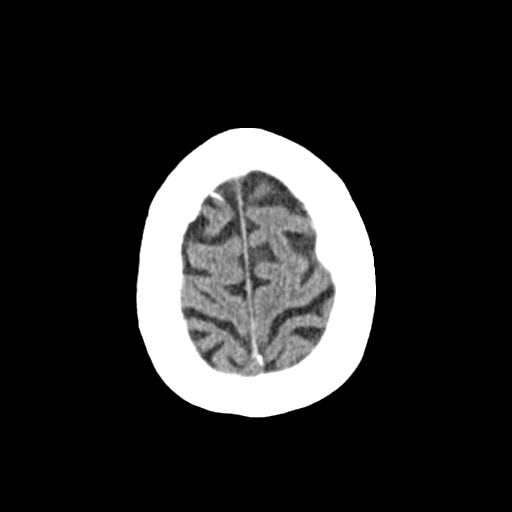
[im 30/33  brain]
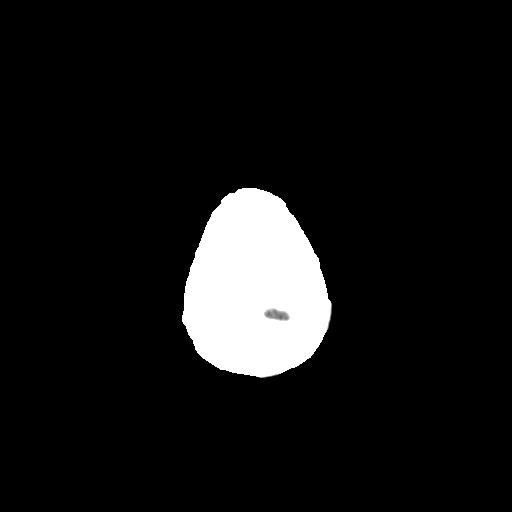
[im 30/33  bone]
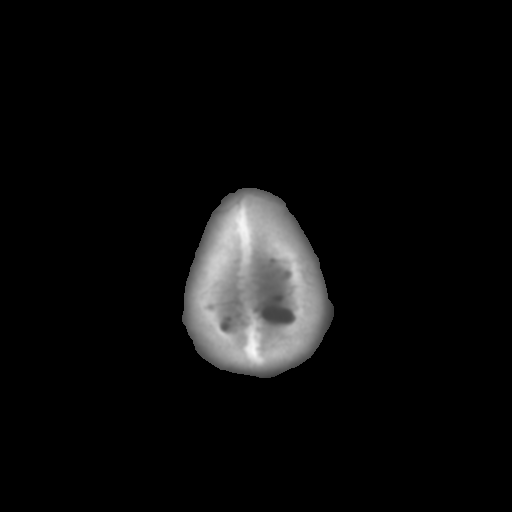

[Series 5: head 3.0 mpr cor · coronal · 0.33mm/px · 3 of 71 slices shown]
[im 24/71  brain]
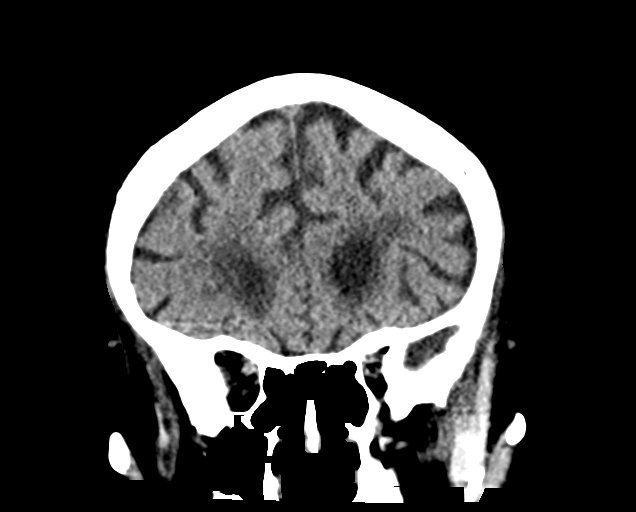
[im 32/71  brain]
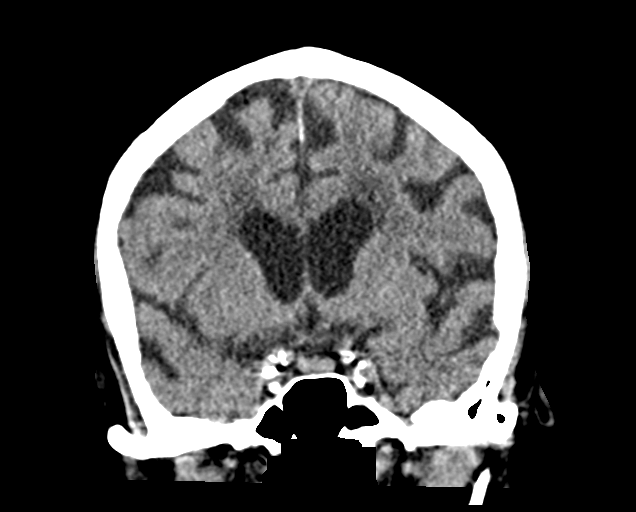
[im 39/71  brain]
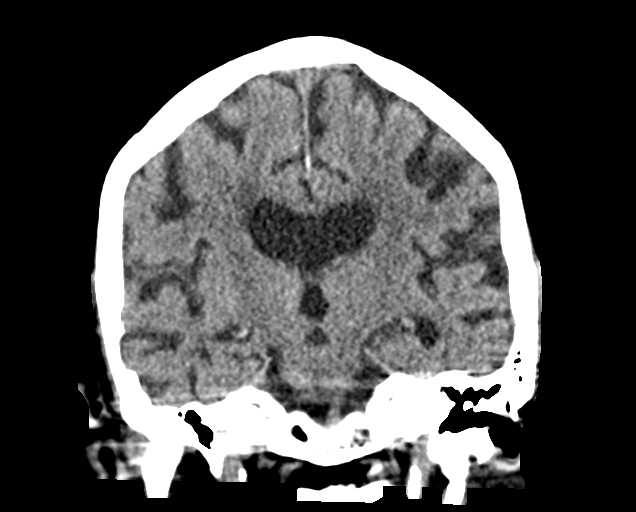

[Series 6: head 3.0 mpr sag · sagittal · 0.33mm/px · 3 of 55 slices shown]
[im 19/55  brain]
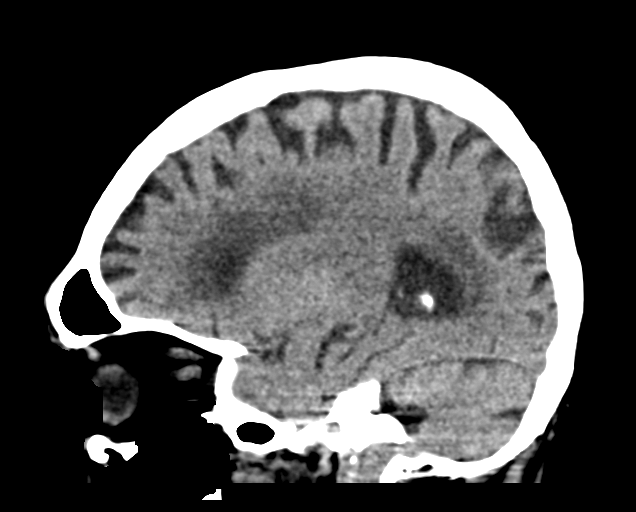
[im 28/55  brain]
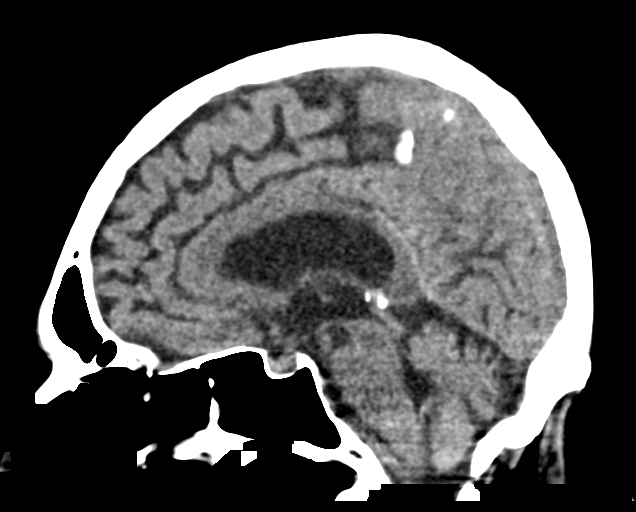
[im 37/55  brain]
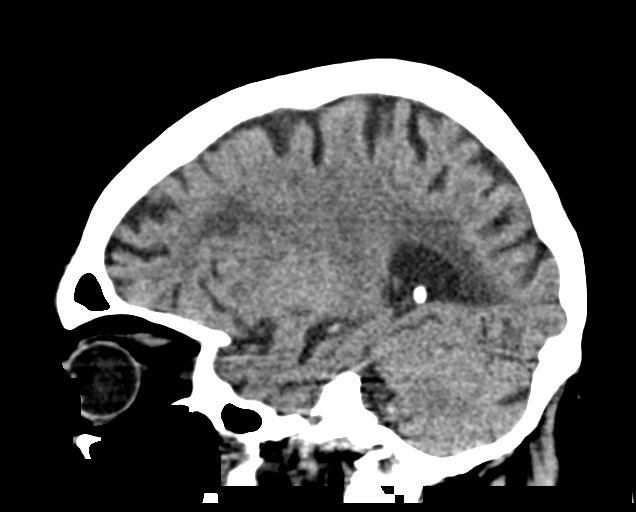

[15 of 47 positions shown; findings below may reference images not displayed]

FINDINGS: CT HEAD FINDINGS

Brain: Ventricles and sulci are appropriate for patient's age.
Periventricular and subcortical white matter hypodensity compatible
with chronic microvascular ischemic changes. No evidence for acute
cortically based infarct, intracranial hemorrhage, mass lesion or
mass-effect.

Vascular: Unremarkable

Skull: Intact.

Sinuses/Orbits: Paranasal sinuses well aerated. Mastoid air cells
unremarkable. Orbits unremarkable.

Other: None

CT CERVICAL SPINE FINDINGS

Alignment: Mild grade 1 anterolisthesis C4 on C5. Otherwise normal
anatomic alignment.

Skull base and vertebrae: Degenerative changes.  Intact.

Soft tissues and spinal canal: No prevertebral fluid or swelling. No
visible canal hematoma.

Disc levels:  Degenerative disc disease most pronounced C5-6.

Upper chest: Unremarkable.

Other: None.
IMPRESSION: 1. No acute intracranial process. Atrophy and chronic microvascular
ischemic changes.
2. No acute cervical spine fracture. Degenerative disc disease.

## 2021-09-18 IMAGING — DX DG CHEST 1V PORT
1 series · 1 of 1 positions shown · non-contrast
Comparison: 02/21/2020

CLINICAL DATA: Hypoxia.

EXAM:
PORTABLE CHEST 1 VIEW

[chest ap]
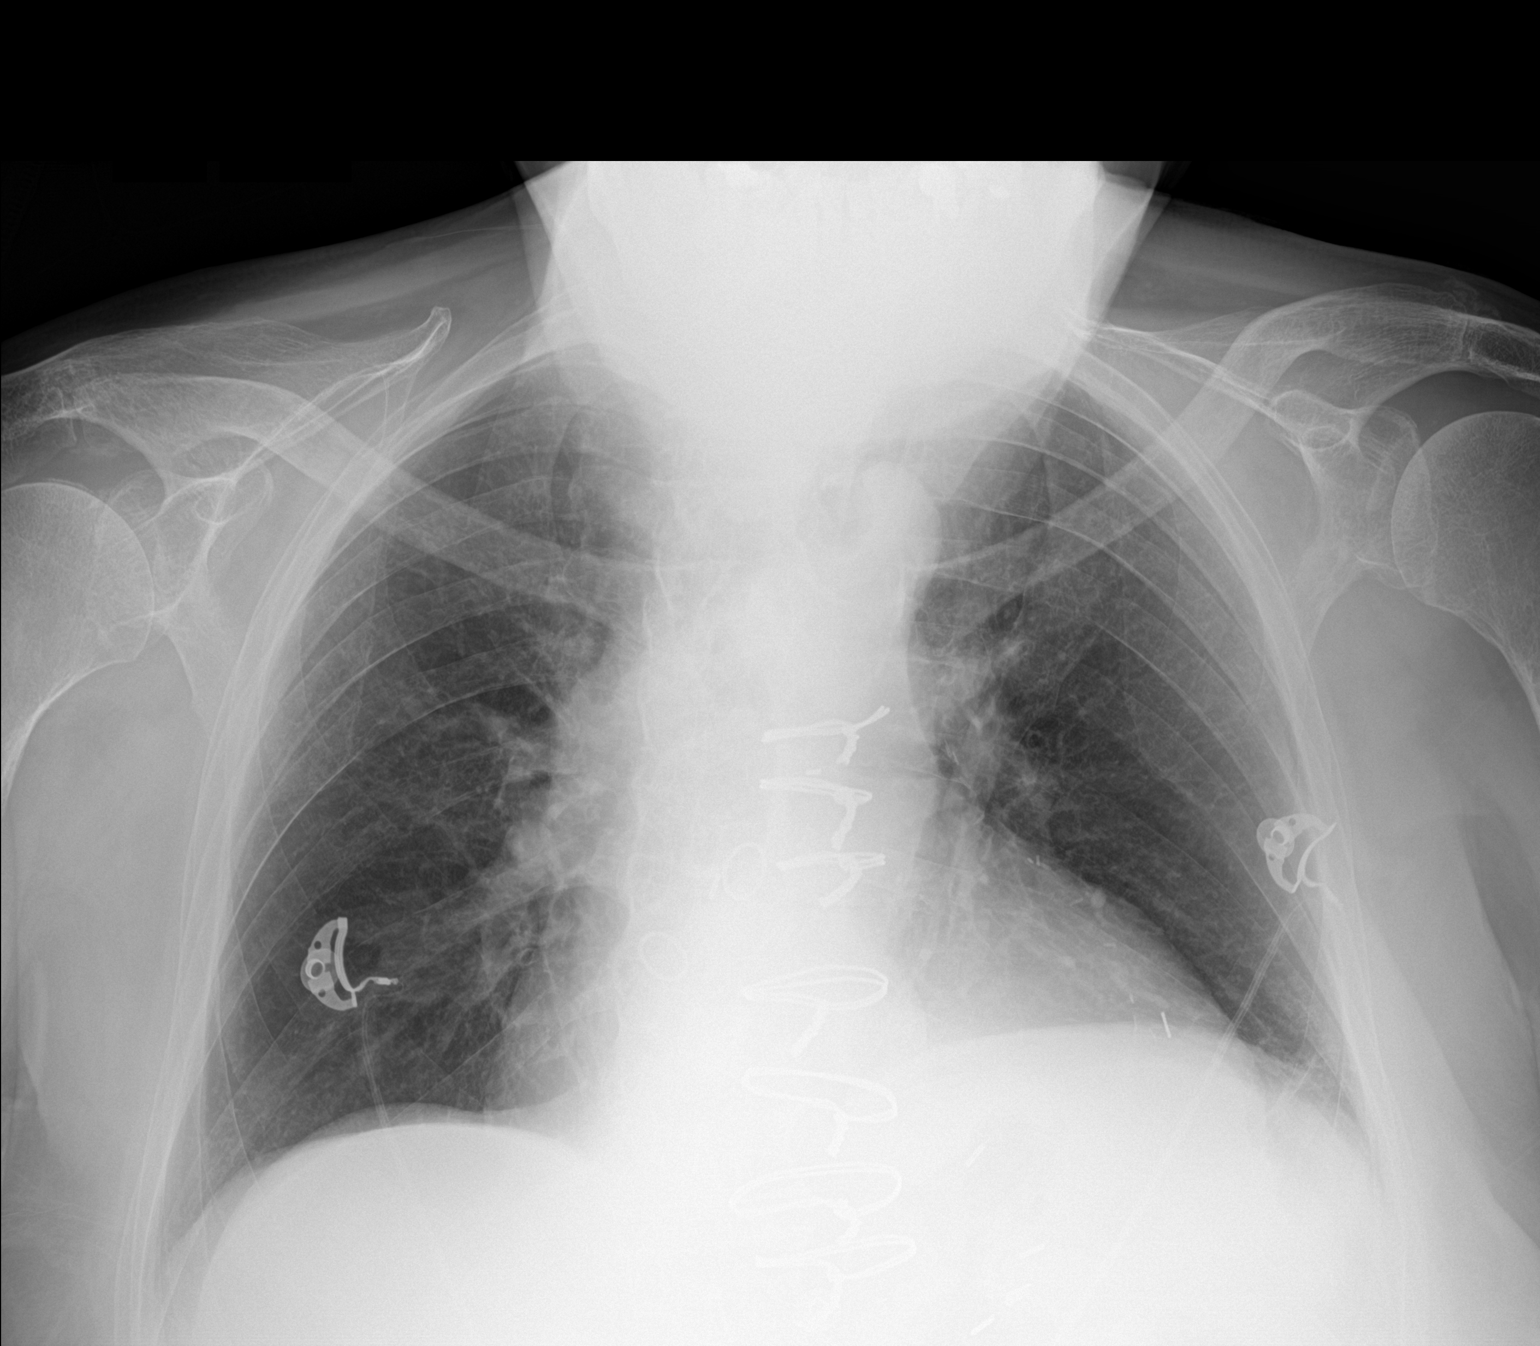

[1 of 1 positions shown; findings below may reference images not displayed]

FINDINGS: Sequelae of CABG are again identified. The cardiac silhouette is
mildly to moderately enlarged. Lung volumes are low with mild left
basilar opacity likely reflecting atelectasis. No edema, sizable
pleural effusion, or pneumothorax is identified with note made of
partial obscuration of the lung apices by the patient's chin. No
acute osseous abnormality is seen.
IMPRESSION: Low lung volumes with left basilar atelectasis.
